# Patient Record
Sex: Female | Born: 1955 | Race: White | Hispanic: No | Smoking: Former smoker
Health system: Southern US, Community
[De-identification: ages and names within clinical notes are randomized; demographics above are authoritative.]

## PROBLEM LIST (undated history)

## (undated) DIAGNOSIS — R87629 Unspecified abnormal cytological findings in specimens from vagina: Secondary | ICD-10-CM

## (undated) DIAGNOSIS — N3289 Other specified disorders of bladder: Secondary | ICD-10-CM

## (undated) DIAGNOSIS — K224 Dyskinesia of esophagus: Secondary | ICD-10-CM

## (undated) DIAGNOSIS — N951 Menopausal and female climacteric states: Secondary | ICD-10-CM

## (undated) DIAGNOSIS — K229 Disease of esophagus, unspecified: Secondary | ICD-10-CM

## (undated) DIAGNOSIS — K317 Polyp of stomach and duodenum: Secondary | ICD-10-CM

## (undated) DIAGNOSIS — N939 Abnormal uterine and vaginal bleeding, unspecified: Secondary | ICD-10-CM

## (undated) DIAGNOSIS — E119 Type 2 diabetes mellitus without complications: Secondary | ICD-10-CM

## (undated) DIAGNOSIS — Z9889 Other specified postprocedural states: Secondary | ICD-10-CM

## (undated) DIAGNOSIS — T8859XA Other complications of anesthesia, initial encounter: Secondary | ICD-10-CM

## (undated) DIAGNOSIS — K635 Polyp of colon: Secondary | ICD-10-CM

## (undated) DIAGNOSIS — R112 Nausea with vomiting, unspecified: Secondary | ICD-10-CM

## (undated) DIAGNOSIS — D126 Benign neoplasm of colon, unspecified: Secondary | ICD-10-CM

## (undated) DIAGNOSIS — I10 Essential (primary) hypertension: Secondary | ICD-10-CM

## (undated) DIAGNOSIS — R638 Other symptoms and signs concerning food and fluid intake: Secondary | ICD-10-CM

## (undated) DIAGNOSIS — K219 Gastro-esophageal reflux disease without esophagitis: Secondary | ICD-10-CM

## (undated) DIAGNOSIS — E669 Obesity, unspecified: Secondary | ICD-10-CM

## (undated) DIAGNOSIS — F419 Anxiety disorder, unspecified: Secondary | ICD-10-CM

## (undated) DIAGNOSIS — E781 Pure hyperglyceridemia: Secondary | ICD-10-CM

## (undated) DIAGNOSIS — R7881 Bacteremia: Secondary | ICD-10-CM

## (undated) HISTORY — DX: Type 2 diabetes mellitus without complications: E11.9

## (undated) HISTORY — DX: Menopausal and female climacteric states: N95.1

## (undated) HISTORY — PX: CERVICAL DISCECTOMY: SHX98

## (undated) HISTORY — DX: Abnormal uterine and vaginal bleeding, unspecified: N93.9

## (undated) HISTORY — DX: Unspecified abnormal cytological findings in specimens from vagina: R87.629

## (undated) HISTORY — DX: Gastro-esophageal reflux disease without esophagitis: K21.9

## (undated) HISTORY — PX: OTHER SURGICAL HISTORY: SHX169

## (undated) HISTORY — PX: BACK SURGERY: SHX140

## (undated) HISTORY — PX: TUBAL LIGATION: SHX77

## (undated) HISTORY — DX: Other symptoms and signs concerning food and fluid intake: R63.8

## (undated) HISTORY — DX: Essential (primary) hypertension: I10

## (undated) HISTORY — PX: COLONOSCOPY: SHX174

## (undated) HISTORY — PX: KNEE SURGERY: SHX244

## (undated) HISTORY — PX: LEEP: SHX91

## (undated) HISTORY — DX: Anxiety disorder, unspecified: F41.9

---

## 2004-10-20 ENCOUNTER — Ambulatory Visit: Payer: Self-pay | Admitting: Obstetrics and Gynecology

## 2005-09-29 ENCOUNTER — Ambulatory Visit: Payer: Self-pay

## 2006-09-19 ENCOUNTER — Other Ambulatory Visit: Payer: Self-pay

## 2006-09-19 ENCOUNTER — Observation Stay: Payer: Self-pay | Admitting: Internal Medicine

## 2006-09-20 ENCOUNTER — Other Ambulatory Visit: Payer: Self-pay

## 2007-03-27 ENCOUNTER — Ambulatory Visit: Payer: Self-pay

## 2011-08-25 ENCOUNTER — Ambulatory Visit: Payer: Self-pay | Admitting: Obstetrics and Gynecology

## 2012-02-09 ENCOUNTER — Ambulatory Visit: Payer: Self-pay | Admitting: Obstetrics and Gynecology

## 2012-02-09 DIAGNOSIS — I10 Essential (primary) hypertension: Secondary | ICD-10-CM

## 2012-02-09 LAB — CBC
HCT: 42.8 % (ref 35.0–47.0)
MCH: 29.9 pg (ref 26.0–34.0)
MCV: 91 fL (ref 80–100)
Platelet: 292 10*3/uL (ref 150–440)
RBC: 4.72 10*6/uL (ref 3.80–5.20)
WBC: 6.6 10*3/uL (ref 3.6–11.0)

## 2012-02-09 LAB — BASIC METABOLIC PANEL
BUN: 14 mg/dL (ref 7–18)
Calcium, Total: 8.8 mg/dL (ref 8.5–10.1)
Co2: 25 mmol/L (ref 21–32)
Creatinine: 0.82 mg/dL (ref 0.60–1.30)
EGFR (Non-African Amer.): >60
Osmolality: 281 (ref 275–301)
Potassium: 3.9 mmol/L (ref 3.5–5.1)
Sodium: 141 mmol/L (ref 136–145)

## 2012-02-14 ENCOUNTER — Ambulatory Visit: Payer: Self-pay | Admitting: Obstetrics and Gynecology

## 2012-10-25 ENCOUNTER — Ambulatory Visit: Payer: Self-pay | Admitting: Obstetrics and Gynecology

## 2014-09-11 LAB — HM PAP SMEAR: HM Pap smear: NEGATIVE

## 2014-09-18 ENCOUNTER — Ambulatory Visit: Payer: Self-pay | Admitting: Obstetrics and Gynecology

## 2014-09-18 LAB — HM MAMMOGRAPHY

## 2014-12-03 DIAGNOSIS — E781 Pure hyperglyceridemia: Secondary | ICD-10-CM | POA: Insufficient documentation

## 2014-12-03 DIAGNOSIS — K219 Gastro-esophageal reflux disease without esophagitis: Secondary | ICD-10-CM | POA: Insufficient documentation

## 2014-12-03 DIAGNOSIS — F411 Generalized anxiety disorder: Secondary | ICD-10-CM | POA: Insufficient documentation

## 2014-12-03 DIAGNOSIS — R638 Other symptoms and signs concerning food and fluid intake: Secondary | ICD-10-CM | POA: Insufficient documentation

## 2014-12-03 DIAGNOSIS — I10 Essential (primary) hypertension: Secondary | ICD-10-CM | POA: Insufficient documentation

## 2014-12-11 ENCOUNTER — Ambulatory Visit: Payer: Self-pay | Admitting: Gastroenterology

## 2014-12-16 ENCOUNTER — Ambulatory Visit: Payer: Self-pay | Admitting: Gastroenterology

## 2014-12-16 DIAGNOSIS — N3289 Other specified disorders of bladder: Secondary | ICD-10-CM

## 2014-12-16 HISTORY — DX: Other specified disorders of bladder: N32.89

## 2015-03-09 NOTE — Op Note (Signed)
PATIENT NAME:  Gloria Wilkins, Gloria Wilkins MR#:  [id number] DATE OF BIRTH:  [birth year]  DATE OF PROCEDURE:  02/14/2012  PREOPERATIVE DIAGNOSIS: Abnormal uterine bleeding on hormone replacement therapy.   POSTOPERATIVE DIAGNOSIS: Abnormal uterine bleeding on hormone replacement therapy.   PROCEDURES PERFORMED: 1. Hysteroscopy.  2. Dilation and curettage of endometrium.  3. NovaSure endometrial ablation.   SURGEON: Alanda Slim. DeFrancesco, MD   FIRST ASSISTANT: None.   ANESTHESIA: General.   INDICATIONS: The patient is a 59 year old white female who presents for surgical management of chronic abnormal uterine bleeding while on hormone replacement therapy. The patient did have a preoperative endometrial biopsy that was benign. Pelvic ultrasound revealed no significant abnormalities.   Findings at surgery revealed a uterus which sounded to 8.5 cm. The cervical length was 4.5 cm. The endometrial cavity had no significant pathology identified on hysteroscopy. Post NovaSure view revealed a good char effect.   DESCRIPTION OF PROCEDURE: The patient was brought to the operating room where she was placed in the supine position. General anesthesia was induced without difficulty. She was placed in the dorsal lithotomy position using the candy-cane stirrups. A Betadine perineal and intravaginal prep and drape was performed in the standard fashion. A red Robinson catheter was used to drain 250 mL of urine from the bladder. A weighted speculum was placed into the vagina, a single-tooth tenaculum was placed on the anterior lip of the cervix, and the uterus was sounded to 8.5 cm.  Hegar dilators were used up to a #8 caliber to dilate the endocervical canal. The cervical length was measured at 4.5 cm. Hysteroscopy was performed which revealed an endometrial cavity devoid of pathologic lesions. This was followed by curettage of the endometrium with both smooth and serrated curettes. This tissue was sent to pathology. Next, the  NovaSure ablation was performed with the NovaSure instrument being set with a cavity length of 4 cm. Placement of the NovaSure, in the intracavitary position, was appropriate with deployment of the ablation instrument. The cavity width was 3.5 cm. Cavity test was completed and the NovaSure ablation was performed over 75 seconds. Following completion of the procedure, the instrument was removed. Repeat hysteroscopy was performed and revealed a good char effect of the endometrial cavity. This was photo documented. Upon completion of the procedure, all instrumentation was removed. The patient was then mobilized, awakened, and taken to the recovery room in satisfactory condition. Estimated blood loss was minimal. There were no complications. All instrument, needle, and sponge counts were verified as correct. ____________________________ Alanda Slim. DeFrancesco, MD mad:slb D: 02/14/2012 14:57:28 ET T: 02/14/2012 16:05:46 ET JOB#: [id number]  cc: Hassell Done A. DeFrancesco, MD, <Dictator> Alanda Slim DEFRANCESCO MD ELECTRONICALLY SIGNED 02/22/2012 13:32

## 2015-03-09 NOTE — H&P (Signed)
PATIENT NAME:  ODETTE, Gloria Wilkins MR#:  [id number] DATE OF BIRTH:  1956/05/14  DATE OF ADMISSION:  02/14/2012  DIAGNOSIS: Symptomatic abnormal uterine bleeding on hormone replacement therapy.  HISTORY: Gloria Wilkins is a 59 year old married white female, para 2-0-0-2, using BTL for contraception, on Climara pro patch weekly as well as Estrace cream intravaginally biweekly, presents for definitive surgical management of this condition through conservative surgery. Patient has had pelvic ultrasound which was normal. Preoperative endometrial biopsy revealed benign findings showing weakly proliferative endometrium on biopsy of 01/19/2012. Patient desires definitive conservative surgery through hysteroscopy, dilation and curettage with NovaSure.   PAST MEDICAL HISTORY:  1. Chronic hypertension.  2. Obesity.  3. Unstable bladder.  4. Atypical chest pain with normal Myoview 09/2006.  5. Gastroesophageal reflux disease.   PAST SURGICAL HISTORY:  1. Diskectomy in 1990.  2. Bilateral tubal ligation 1980.  PAST OB HISTORY: Para 2-0-0-2; cesarean section x1, spontaneous vaginal delivery x1.   PAST GYN HISTORY: Patient denies history of abnormal Pap smears.   FAMILY HISTORY: Breast cancer in two maternal aunts. No history of colon or ovary cancer.   SOCIAL HISTORY: Patient is a nonsmoker. She quit in 2002. Patient denies alcohol use and drug use. Patient is a Biochemist, clinical at [employer].   REVIEW OF SYSTEMS: Patient denies recent illness. She denies history of coagulopathy. She denies reactive airway disease.   MEDICATIONS: 1. Plendil 1 tablet daily. 2. Bystolic 5 mg daily. 3. Omeprazole 20 mg daily.  4. Potassium daily. 5. Climara pro patch weekly. 6. Estrace cream one applicator biweekly.   DRUG ALLERGIES: Sulfa.   PHYSICAL EXAMINATION:  VITAL SIGNS: Height 5 feet 4 inches, weight 236, blood pressure 147/87, heart rate 62, BMI 41.   GENERAL: Patient is a pleasant well-appearing white female  in no acute distress. She is alert and oriented. Affect is appropriate.   NECK: Supple. There is no thyromegaly or adenopathy.   LUNGS: Clear.   HEART: Regular rate and rhythm without murmur, S3 or S4.   ABDOMEN: Soft, nontender. Moderate pannus is appreciated. No hepatosplenomegaly. No pelvic masses. No hernias.   PELVIC: External genitalia notable for a 2 cm verrucous mole on her mons pubis. Perineum is otherwise normal. BUS normal. Vagina has good estrogen effect. Cervix is without lesions. Uterus is midplane. Size difficult to assess secondary to body habitus. There are no adnexal masses appreciated. The uterus did sound to 7 cm on sounding.   EXTREMITIES: Without clubbing, cyanosis, or edema.   LABORATORY, DIAGNOSTIC AND RADIOLOGICAL DATA: Ultrasound from 01/05/2012 revealed a uterus measuring 6.41 x 4.57 x 3.81 cm. Endometrial stripe was 0.49 cm. Left and right ovaries were normal. Endometrial biopsy from 01/19/2012 revealed benign inactive weakly proliferative endometrium. No hyperplasia, atypia or malignancy is seen.   IMPRESSION: Symptomatic abnormal uterine bleeding on continuous hormone replacement therapy with normal pelvic ultrasound and benign endometrial biopsy.   PLAN: Diagnostic hysteroscopy with dilation and curettage of the endometrium and NovaSure endometrial ablation. Date of surgery 02/14/2012.    CONSENT NOTE: Gloria Wilkins is to undergo surgery on 02/14/2012. She is understanding of the planned procedures and is aware of and is accepting of all surgical risks which include but are not limited to bleeding, infection, pelvic organ injury with need for repair, uterine perforation, anesthesia risks, and death. All questions are answered. Informed consent is given. Patient is ready and willing to proceed with surgery as scheduled.  ____________________________ Alanda Slim Ji Feldner, MD mad:cms D: 02/11/2012 11:02:59 ET T: 02/11/2012  12:10:02  ET JOB#: [id number]  cc: Hassell Done A. Shine Mikes, MD, <Dictator> Alanda Slim Alif Petrak MD ELECTRONICALLY SIGNED 02/12/2012 9:22

## 2015-03-10 LAB — SURGICAL PATHOLOGY

## 2015-04-04 ENCOUNTER — Other Ambulatory Visit: Payer: Self-pay | Admitting: Sports Medicine

## 2015-04-04 DIAGNOSIS — M25562 Pain in left knee: Secondary | ICD-10-CM

## 2015-04-04 DIAGNOSIS — S83282A Other tear of lateral meniscus, current injury, left knee, initial encounter: Secondary | ICD-10-CM

## 2015-04-08 ENCOUNTER — Ambulatory Visit
Admission: RE | Admit: 2015-04-08 | Discharge: 2015-04-08 | Disposition: A | Discharge status: Home / Self Care | Payer: Managed Care, Other (non HMO) | Source: Ambulatory Visit | Attending: Sports Medicine | Admitting: Sports Medicine

## 2015-04-08 DIAGNOSIS — M1712 Unilateral primary osteoarthritis, left knee: Secondary | ICD-10-CM | POA: Insufficient documentation

## 2015-04-08 DIAGNOSIS — M25862 Other specified joint disorders, left knee: Secondary | ICD-10-CM | POA: Insufficient documentation

## 2015-04-08 DIAGNOSIS — M25562 Pain in left knee: Secondary | ICD-10-CM

## 2015-04-08 DIAGNOSIS — S83282A Other tear of lateral meniscus, current injury, left knee, initial encounter: Secondary | ICD-10-CM

## 2015-04-08 DIAGNOSIS — S83242A Other tear of medial meniscus, current injury, left knee, initial encounter: Secondary | ICD-10-CM | POA: Diagnosis not present

## 2015-09-17 ENCOUNTER — Encounter: Payer: Self-pay | Admitting: Obstetrics and Gynecology

## 2015-09-17 ENCOUNTER — Ambulatory Visit (INDEPENDENT_AMBULATORY_CARE_PROVIDER_SITE_OTHER): Payer: Managed Care, Other (non HMO) | Admitting: Obstetrics and Gynecology

## 2015-09-17 VITALS — BP 112/73 | HR 63 | Ht 65.0 in | Wt 247.2 lb

## 2015-09-17 DIAGNOSIS — Z1239 Encounter for other screening for malignant neoplasm of breast: Secondary | ICD-10-CM

## 2015-09-17 DIAGNOSIS — Z01419 Encounter for gynecological examination (general) (routine) without abnormal findings: Secondary | ICD-10-CM

## 2015-09-17 DIAGNOSIS — Z780 Asymptomatic menopausal state: Secondary | ICD-10-CM

## 2015-09-17 DIAGNOSIS — Z0000 Encounter for general adult medical examination without abnormal findings: Secondary | ICD-10-CM | POA: Diagnosis not present

## 2015-09-17 DIAGNOSIS — E669 Obesity, unspecified: Secondary | ICD-10-CM

## 2015-09-17 MED ORDER — ESTRADIOL-LEVONORGESTREL 0.045-0.015 MG/DAY TD PTWK: 1.0000 | MEDICATED_PATCH | TRANSDERMAL | Status: DC

## 2015-09-17 MED ORDER — SERTRALINE HCL 50 MG PO TABS: 50.0000 mg | ORAL_TABLET | Freq: Every day | ORAL | Status: DC

## 2015-09-17 NOTE — Progress Notes (Signed)
Patient ID: Gloria Wilkins, female   DOB: 20-Mar-1956, 59 y.o.   MRN: [id number] ANNUAL PREVENTATIVE CARE GYN  ENCOUNTER NOTE  Subjective:       Gloria Wilkins is a 59 y.o. G47P2002 female here for a routine annual gynecologic exam.  Current complaints: 1.  none  Colonoscopy this year demonstrated 15 polyps, a few adenomatous; repeat colonoscopy in 2 years.  Is planned.   Gynecologic History No LMP recorded. Patient has had an ablation. Contraception: ablation Last Pap: 09/03/2014 neg/neg. Results were: normal Last mammogram: 2015. Results were: normal  Obstetric History OB History  Gravida Para Term Preterm AB SAB TAB Ectopic Multiple Living  2 2 2       2     # Outcome Date GA Lbr Len/2nd Weight Sex Delivery Anes PTL Lv  2 Term 64     CS-LTranv   Y  1 Term 19     Vag-Spont   Y      Past Medical History  Diagnosis Date  . Vaginal Pap smear, abnormal   . Abnormal uterine bleeding (AUB)   . GERD (gastroesophageal reflux disease)   . HTN (hypertension)   . Perimenopausal   . Anxiety   . Increased BMI   . DM (diabetes mellitus) ([place])     Past Surgical History  Procedure Laterality Date  . Ablation      endometrial  . Cervical discectomy    . Leep    . Tubal ligation    . Knee surgery Left     Current Outpatient Prescriptions on File Prior to Visit  Medication Sig Dispense Refill  . estradiol-levonorgestrel (CLIMARAPRO) 0.045-0.015 MG/DAY Place onto the skin.    . felodipine (PLENDIL) 5 MG 24 hr tablet Take by mouth.    . Fenofibrate 40 MG TABS Take by mouth.    . losartan (COZAAR) 25 MG tablet Take by mouth.    . metFORMIN (GLUCOPHAGE) 500 MG tablet Take by mouth.    . nebivolol (BYSTOLIC) 5 MG tablet Take by mouth.    . pantoprazole (PROTONIX) 20 MG tablet Take by mouth.    . Potassium 99 MG TABS Take by mouth.    . sertraline (ZOLOFT) 50 MG tablet Take by mouth.     No current facility-administered medications on file prior to visit.    Allergies   Allergen Reactions  . Sulfa Antibiotics Rash    Social History   Social History  . Marital Status: Married    Spouse Name: N/A  . Number of Children: N/A  . Years of Education: N/A   Occupational History  . Not on file.   Social History Main Topics  . Smoking status: Former Smoker    Quit date: 11/15/2000  . Smokeless tobacco: Not on file  . Alcohol Use: No  . Drug Use: No  . Sexual Activity: Yes    Birth Control/ Protection: Surgical   Other Topics Concern  . Not on file   Social History Narrative    Family History  Problem Relation Age of Onset  . Heart disease Father   . Breast cancer Maternal Aunt   . Diabetes Neg Hx   . Colon cancer Neg Hx   . Ovarian cancer Neg Hx     The following portions of the patient's history were reviewed and updated as appropriate: allergies, current medications, past family history, past medical history, past social history, past surgical history and problem list.  Review of Systems ROS Review of Systems -  General ROS: negative for - chills, fatigue, fever, hot flashes, night sweats, weight gain or weight loss Psychological ROS: negative for - anxiety, decreased libido, depression, mood swings, physical abuse or sexual abuse Ophthalmic ROS: negative for - blurry vision, eye pain or loss of vision ENT ROS: negative for - headaches, hearing change, visual changes or vocal changes Allergy and Immunology ROS: negative for - hives, itchy/watery eyes or seasonal allergies Hematological and Lymphatic ROS: negative for - bleeding problems, bruising, swollen lymph nodes or weight loss Endocrine ROS: negative for - galactorrhea, hair pattern changes, hot flashes, malaise/lethargy, mood swings, palpitations, polydipsia/polyuria, skin changes, temperature intolerance or unexpected weight changes Breast ROS: negative for - new or changing breast lumps or nipple discharge Respiratory ROS: negative for - cough or shortness of  breath Cardiovascular ROS: negative for - chest pain, irregular heartbeat, palpitations or shortness of breath Gastrointestinal ROS: no abdominal pain, change in bowel habits, or black or bloody stools Genito-Urinary ROS: no dysuria, trouble voiding, or hematuria Musculoskeletal ROS: negative for - joint pain or joint stiffness Neurological ROS: negative for - bowel and bladder control changes Dermatological ROS: negative for rash and skin lesion changes   Objective:   BP 112/73 mmHg  Pulse 63  Ht 5\' 5"  (1.651 m)  Wt 247 lb 3.2 oz (112.129 kg)  BMI 41.14 kg/m2 CONSTITUTIONAL: Well-developed, well-nourished female in no acute distress.  PSYCHIATRIC: Normal mood and affect. Normal behavior. Normal judgment and thought content. [place]: Alert and oriented to person, place, and time. Normal muscle tone coordination. No cranial nerve deficit noted. HENT:  Normocephalic, atraumatic, External right and left ear normal. Oropharynx is clear and moist EYES: Conjunctivae and EOM are normal. Pupils are equal, round, and reactive to light. No scleral icterus.  NECK: Normal range of motion, supple, no masses.  Normal thyroid.  SKIN: Skin is warm and dry. No rash noted. Not diaphoretic. No erythema. No pallor. CARDIOVASCULAR: Normal heart rate noted, regular rhythm, no murmur. RESPIRATORY: Clear to auscultation bilaterally. Effort and breath sounds normal, no problems with respiration noted. BREASTS: Symmetric in size. No masses, skin changes, nipple drainage, or lymphadenopathy. ABDOMEN: Soft, normal bowel sounds, no distention noted.  No tenderness, rebound or guarding. Large pannus BLADDER: Normal PELVIC:  External Genitalia: Normal  BUS: Normal  Vagina: Normal  Cervix: Normal; no cervical motion tenderness  Uterus: Normal; not palpable  Adnexa: Normal  RV: External Exam NormaI, No Rectal Masses and Normal Sphincter tone  MUSCULOSKELETAL: Normal range of motion. No tenderness.  No cyanosis,  clubbing, or edema.  2+ distal pulses. LYMPHATIC: No Axillary, Supraclavicular, or Inguinal Adenopathy.    Assessment:   Annual gynecologic examination 59 y.o. Contraception: ablation Obesity 1  Menopause Plan:  Pap: Not needed Mammogram: Ordered Stool Guaiac Testing:  Not Indicated- colonoscopy- 12/2014 Labs: thru pcp Routine preventative health maintenance measures emphasized: Exercise/Diet/Weight control, Tobacco Warnings and Alcohol/Substance use risks Refill Climara pro. Continue calcium and vitamin D supplementation. Continue with healthy eating, exercise, and weight loss at 1 pound per month goal. Return to [place], CMA  Brayton Mars, MD  Note: This dictation was prepared with Dragon dictation along with smaller phrase technology. Any transcriptional errors that result from this process are unintentional.

## 2015-09-17 NOTE — Patient Instructions (Signed)
1.No Pap smear until 2018 2.  Mammogram recommended. 3.  No stool guaiac testing this year due to recent colonoscopy. 4.  Climara pro is refill. 5.  Continue with calcium and vitamin D supplementation. 6.  Continue with healthier eating, exercise, and weight loss 1 month GOAL. 7.  Return in 1 year

## 2016-05-13 ENCOUNTER — Encounter: Payer: Self-pay | Admitting: Obstetrics and Gynecology

## 2016-05-13 ENCOUNTER — Ambulatory Visit (INDEPENDENT_AMBULATORY_CARE_PROVIDER_SITE_OTHER): Payer: Managed Care, Other (non HMO) | Admitting: Obstetrics and Gynecology

## 2016-05-13 VITALS — BP 102/66 | HR 60 | Ht 65.0 in | Wt 257.0 lb

## 2016-05-13 DIAGNOSIS — Z780 Asymptomatic menopausal state: Secondary | ICD-10-CM | POA: Diagnosis not present

## 2016-05-13 DIAGNOSIS — N951 Menopausal and female climacteric states: Secondary | ICD-10-CM | POA: Diagnosis not present

## 2016-05-13 MED ORDER — EST ESTROGENS-METHYLTEST 1.25-2.5 MG PO TABS: 1.0000 | ORAL_TABLET | Freq: Every day | ORAL | Status: DC

## 2016-05-13 MED ORDER — MEDROXYPROGESTERONE ACETATE 5 MG PO TABS: ORAL_TABLET | ORAL | Status: DC

## 2016-05-13 NOTE — Patient Instructions (Signed)
1. Stop Climara Pro 2. Begin Estratest 1.25/2.5 mg daily 3. Begin Provera 5 mg daily 4. Return in 6 weeks for follow-up

## 2016-05-28 DIAGNOSIS — Z780 Asymptomatic menopausal state: Secondary | ICD-10-CM | POA: Insufficient documentation

## 2016-05-28 DIAGNOSIS — N951 Menopausal and female climacteric states: Secondary | ICD-10-CM | POA: Insufficient documentation

## 2016-05-28 NOTE — Progress Notes (Signed)
Chief complaint: 1. Vasomotor symptoms 2. HRT therapy  Patient presents for follow-up on medication. Climara Pro patch is not effectively controlling her symptomatology. Other options of vasomotor symptom control were reviewed including both hormonal as well as non hormonal therapies.  Patient has increased exercise activity and is working on weight loss.  OBJECTIVE: BP 102/66 mmHg  Pulse 60  Ht 5\' 5"  (1.651 m)  Wt 257 lb (116.574 kg)  BMI 42.77 kg/m2   Plan at this time is to stop the Climara Pro patch. Began Estratest 1.25/2.5 mg daily along with Provera 5 mg daily Patient is to monitor symptomatology and return in 6 weeks for follow-up  A total of 15 minutes were spent face-to-face with the patient during this encounter and over half of that time dealt with counseling and coordination of care.  Brayton Mars, MD   Note: This dictation was prepared with Dragon dictation along with smaller phrase technology. Any transcriptional errors that result from this process are unintentional.

## 2016-06-23 ENCOUNTER — Ambulatory Visit: Payer: Managed Care, Other (non HMO) | Admitting: Obstetrics and Gynecology

## 2016-08-11 ENCOUNTER — Ambulatory Visit
Admission: RE | Admit: 2016-08-11 | Discharge: 2016-08-11 | Disposition: A | Discharge status: Home / Self Care | Payer: Managed Care, Other (non HMO) | Source: Ambulatory Visit | Attending: Nurse Practitioner | Admitting: Nurse Practitioner

## 2016-08-11 ENCOUNTER — Other Ambulatory Visit: Payer: Self-pay | Admitting: Nurse Practitioner

## 2016-08-11 DIAGNOSIS — R52 Pain, unspecified: Secondary | ICD-10-CM

## 2016-08-13 ENCOUNTER — Inpatient Hospital Stay
Admission: AD | Admit: 2016-08-13 | Discharge: 2016-08-15 | DRG: 872 | Disposition: A | Discharge status: Home / Self Care | Payer: Managed Care, Other (non HMO) | Source: Ambulatory Visit | Attending: Internal Medicine | Admitting: Internal Medicine

## 2016-08-13 ENCOUNTER — Encounter: Payer: Self-pay | Admitting: *Deleted

## 2016-08-13 DIAGNOSIS — Z6841 Body Mass Index (BMI) 40.0 and over, adult: Secondary | ICD-10-CM | POA: Diagnosis not present

## 2016-08-13 DIAGNOSIS — Z79890 Hormone replacement therapy (postmenopausal): Secondary | ICD-10-CM | POA: Diagnosis not present

## 2016-08-13 DIAGNOSIS — F419 Anxiety disorder, unspecified: Secondary | ICD-10-CM | POA: Diagnosis present

## 2016-08-13 DIAGNOSIS — R7881 Bacteremia: Principal | ICD-10-CM | POA: Diagnosis present

## 2016-08-13 DIAGNOSIS — Z87891 Personal history of nicotine dependence: Secondary | ICD-10-CM | POA: Diagnosis not present

## 2016-08-13 DIAGNOSIS — I10 Essential (primary) hypertension: Secondary | ICD-10-CM | POA: Diagnosis present

## 2016-08-13 DIAGNOSIS — E119 Type 2 diabetes mellitus without complications: Secondary | ICD-10-CM | POA: Diagnosis present

## 2016-08-13 DIAGNOSIS — A419 Sepsis, unspecified organism: Secondary | ICD-10-CM

## 2016-08-13 DIAGNOSIS — Z7984 Long term (current) use of oral hypoglycemic drugs: Secondary | ICD-10-CM

## 2016-08-13 DIAGNOSIS — R509 Fever, unspecified: Secondary | ICD-10-CM | POA: Diagnosis present

## 2016-08-13 DIAGNOSIS — Z79899 Other long term (current) drug therapy: Secondary | ICD-10-CM | POA: Diagnosis not present

## 2016-08-13 DIAGNOSIS — K219 Gastro-esophageal reflux disease without esophagitis: Secondary | ICD-10-CM | POA: Diagnosis present

## 2016-08-13 DIAGNOSIS — Z8249 Family history of ischemic heart disease and other diseases of the circulatory system: Secondary | ICD-10-CM

## 2016-08-13 DIAGNOSIS — E669 Obesity, unspecified: Secondary | ICD-10-CM | POA: Diagnosis present

## 2016-08-13 DIAGNOSIS — E1110 Type 2 diabetes mellitus with ketoacidosis without coma: Secondary | ICD-10-CM

## 2016-08-13 HISTORY — DX: Sepsis, unspecified organism: A41.9

## 2016-08-13 LAB — CBC WITH DIFFERENTIAL/PLATELET
Basophils Absolute: 0.1 10*3/uL (ref 0–0.1)
Basophils Relative: 1 %
EOS ABS: 0.2 10*3/uL (ref 0–0.7)
Eosinophils Relative: 2 %
HEMATOCRIT: 42.6 % (ref 35.0–47.0)
HEMOGLOBIN: 14.7 g/dL (ref 12.0–16.0)
LYMPHS ABS: 2.1 10*3/uL (ref 1.0–3.6)
Lymphocytes Relative: 30 %
MCH: 30.2 pg (ref 26.0–34.0)
MCHC: 34.5 g/dL (ref 32.0–36.0)
MCV: 87.4 fL (ref 80.0–100.0)
MONO ABS: 0.6 10*3/uL (ref 0.2–0.9)
MONOS PCT: 8 %
NEUTROS ABS: 4 10*3/uL (ref 1.4–6.5)
NEUTROS PCT: 59 %
Platelets: 335 10*3/uL (ref 150–440)
RBC: 4.87 MIL/uL (ref 3.80–5.20)
RDW: 14.8 % — ABNORMAL HIGH (ref 11.5–14.5)
WBC: 6.9 10*3/uL (ref 3.6–11.0)

## 2016-08-13 LAB — COMPREHENSIVE METABOLIC PANEL
ALBUMIN: 3.7 g/dL (ref 3.5–5.0)
ALK PHOS: 69 U/L (ref 38–126)
ALT: 16 U/L (ref 14–54)
ANION GAP: 7 (ref 5–15)
AST: 22 U/L (ref 15–41)
BUN: 15 mg/dL (ref 6–20)
CALCIUM: 8.9 mg/dL (ref 8.9–10.3)
CO2: 24 mmol/L (ref 22–32)
Chloride: 104 mmol/L (ref 101–111)
Creatinine, Ser: 0.76 mg/dL (ref 0.44–1.00)
GFR calc Af Amer: >60 mL/min (ref >60)
GFR calc non Af Amer: >60 mL/min (ref >60)
GLUCOSE: 129 mg/dL — AB (ref 65–99)
Potassium: 4.3 mmol/L (ref 3.5–5.1)
SODIUM: 135 mmol/L (ref 135–145)
Total Bilirubin: 0.3 mg/dL (ref 0.3–1.2)
Total Protein: 7.5 g/dL (ref 6.5–8.1)

## 2016-08-13 LAB — MAGNESIUM: Magnesium: 2.1 mg/dL (ref 1.7–2.4)

## 2016-08-13 LAB — GLUCOSE, CAPILLARY
GLUCOSE-CAPILLARY: 114 mg/dL — AB (ref 65–99)
Glucose-Capillary: 137 mg/dL — ABNORMAL HIGH (ref 65–99)
Glucose-Capillary: 111 mg/dL — ABNORMAL HIGH (ref 65–99)

## 2016-08-13 MED ORDER — DIPHENHYDRAMINE HCL 50 MG/ML IJ SOLN
INTRAMUSCULAR | Status: AC
  Administered 2016-08-13: 25 mg via INTRAVENOUS
  Filled 2016-08-13: qty 1

## 2016-08-13 MED ORDER — INSULIN ASPART 100 UNIT/ML ~~LOC~~ SOLN: 0.0000 [IU] | Freq: Three times a day (TID) | SUBCUTANEOUS | Status: DC

## 2016-08-13 MED ORDER — NEBIVOLOL HCL 5 MG PO TABS
5.0000 mg | ORAL_TABLET | Freq: Every day | ORAL | Status: DC
  Administered 2016-08-14 – 2016-08-15 (×2): 5 mg via ORAL
  Filled 2016-08-13 (×2): qty 1

## 2016-08-13 MED ORDER — VITAMIN D 1000 UNITS PO TABS
1000.0000 [IU] | ORAL_TABLET | Freq: Every day | ORAL | Status: DC
  Administered 2016-08-14 – 2016-08-15 (×2): 1000 [IU] via ORAL
  Filled 2016-08-13 (×2): qty 1

## 2016-08-13 MED ORDER — FENOFIBRATE 160 MG PO TABS
160.0000 mg | ORAL_TABLET | Freq: Every day | ORAL | Status: DC
Start: 2016-08-13 — End: 2016-08-15
  Administered 2016-08-13 – 2016-08-14 (×2): 160 mg via ORAL
  Filled 2016-08-13 (×3): qty 1

## 2016-08-13 MED ORDER — PANTOPRAZOLE SODIUM 40 MG PO TBEC
40.0000 mg | DELAYED_RELEASE_TABLET | Freq: Every day | ORAL | Status: DC
  Administered 2016-08-14 – 2016-08-15 (×2): 40 mg via ORAL
  Filled 2016-08-13 (×2): qty 1

## 2016-08-13 MED ORDER — HEPARIN SODIUM (PORCINE) 5000 UNIT/ML IJ SOLN
5000.0000 [IU] | Freq: Three times a day (TID) | INTRAMUSCULAR | Status: DC
  Administered 2016-08-14 – 2016-08-15 (×3): 5000 [IU] via SUBCUTANEOUS
  Filled 2016-08-13 (×3): qty 1

## 2016-08-13 MED ORDER — ONDANSETRON HCL 4 MG PO TABS: 4.0000 mg | ORAL_TABLET | Freq: Four times a day (QID) | ORAL | Status: DC | PRN

## 2016-08-13 MED ORDER — INSULIN ASPART 100 UNIT/ML ~~LOC~~ SOLN: 0.0000 [IU] | Freq: Every day | SUBCUTANEOUS | Status: DC

## 2016-08-13 MED ORDER — ACETAMINOPHEN 325 MG PO TABS
650.0000 mg | ORAL_TABLET | Freq: Four times a day (QID) | ORAL | Status: DC | PRN
Start: 2016-08-13 — End: 2016-08-15
  Administered 2016-08-14: 650 mg via ORAL
  Filled 2016-08-13: qty 2

## 2016-08-13 MED ORDER — HEPARIN SODIUM (PORCINE) 5000 UNIT/ML IJ SOLN: 5000.0000 [IU] | Freq: Three times a day (TID) | INTRAMUSCULAR | Status: DC

## 2016-08-13 MED ORDER — ALBUTEROL SULFATE (2.5 MG/3ML) 0.083% IN NEBU: 2.5000 mg | INHALATION_SOLUTION | RESPIRATORY_TRACT | Status: DC | PRN

## 2016-08-13 MED ORDER — ONDANSETRON HCL 4 MG/2ML IJ SOLN: 4.0000 mg | Freq: Four times a day (QID) | INTRAMUSCULAR | Status: DC | PRN

## 2016-08-13 MED ORDER — ACETAMINOPHEN 650 MG RE SUPP: 650.0000 mg | Freq: Four times a day (QID) | RECTAL | Status: DC | PRN

## 2016-08-13 MED ORDER — ESTRADIOL-LEVONORGESTREL 0.045-0.015 MG/DAY TD PTWK: 1.0000 | MEDICATED_PATCH | TRANSDERMAL | Status: DC

## 2016-08-13 MED ORDER — VANCOMYCIN HCL IN DEXTROSE 1-5 GM/200ML-% IV SOLN
1000.0000 mg | Freq: Once | INTRAVENOUS | Status: DC
  Administered 2016-08-13: 1000 mg via INTRAVENOUS
  Filled 2016-08-13: qty 200

## 2016-08-13 MED ORDER — LOSARTAN POTASSIUM 50 MG PO TABS
25.0000 mg | ORAL_TABLET | Freq: Every day | ORAL | Status: DC
  Administered 2016-08-13 – 2016-08-14 (×2): 25 mg via ORAL
  Filled 2016-08-13 (×2): qty 2

## 2016-08-13 MED ORDER — FELODIPINE ER 5 MG PO TB24
5.0000 mg | ORAL_TABLET | Freq: Every day | ORAL | Status: DC
  Administered 2016-08-14 – 2016-08-15 (×2): 5 mg via ORAL
  Filled 2016-08-13 (×2): qty 1

## 2016-08-13 MED ORDER — DIPHENHYDRAMINE HCL 50 MG/ML IJ SOLN
25.0000 mg | Freq: Four times a day (QID) | INTRAMUSCULAR | Status: DC | PRN
  Administered 2016-08-13: 25 mg via INTRAVENOUS

## 2016-08-13 MED ORDER — SERTRALINE HCL 50 MG PO TABS
50.0000 mg | ORAL_TABLET | Freq: Every day | ORAL | Status: DC
  Administered 2016-08-14 – 2016-08-15 (×2): 50 mg via ORAL
  Filled 2016-08-13 (×2): qty 1

## 2016-08-13 MED ORDER — SODIUM CHLORIDE 0.9 % IV SOLN
INTRAVENOUS | Status: DC
  Administered 2016-08-13: 14:00:00 via INTRAVENOUS

## 2016-08-13 MED ORDER — LINEZOLID 600 MG/300ML IV SOLN
600.0000 mg | Freq: Once | INTRAVENOUS | Status: AC
  Administered 2016-08-13: 600 mg via INTRAVENOUS
  Filled 2016-08-13: qty 300

## 2016-08-13 NOTE — Progress Notes (Signed)
Pt having allergic reaction in form of severe itching, erythema, and hives on face and chest to vancomycin infusion. Pharmacy was called and made aware, MD aware and order for benadryl received.

## 2016-08-13 NOTE — Progress Notes (Addendum)
Patient medication list not complete. Mark as reviewed went over with patient. Paged on call pharmacy rep no response.

## 2016-08-13 NOTE — H&P (Signed)
[place] [facility] [place] NAME: Gloria Wilkins    MR#:  [id number]  DATE OF BIRTH:  03/10/1956  DATE OF ADMISSION:  08/13/2016  PRIMARY CARE PHYSICIAN: Perrin Maltese, MD   REQUESTING/REFERRING PHYSICIAN: Perrin Maltese, MD   CHIEF COMPLAINT:  No chief complaint on file.  Positive blood culture. HISTORY OF PRESENT ILLNESS:  Gloria Wilkins  is a 60 y.o. female with a known history of HTN, DM, Diverticulosis and diverticulitis. The patient has history of diverticulosis and had abdominal pain 2 weeks ago. She was suspected to have diverticulitis and was treated with antibiotics. The patient has fever and chills yesterday, went to see her primary care physician. Chest x-ray and urinalysis are negative. But she has fever 100,  leukocytosis at 14,000.  blood culture showed gram-positive cocci. So the patient was called for direct admission to the hospital. The patient denies any abdominal pain, nausea, vomiting or diarrhea.  PAST MEDICAL HISTORY:   Past Medical History:  Diagnosis Date  . Abnormal uterine bleeding (AUB)   . Anxiety   . DM (diabetes mellitus) ([place])   . GERD (gastroesophageal reflux disease)   . HTN (hypertension)   . Increased BMI   . Perimenopausal   . Vaginal Pap smear, abnormal     PAST SURGICAL HISTORY:   Past Surgical History:  Procedure Laterality Date  . ablation     endometrial  . CERVICAL DISCECTOMY    . KNEE SURGERY Left   . LEEP    . TUBAL LIGATION      SOCIAL HISTORY:   Social History  Substance Use Topics  . Smoking status: Former Smoker    Quit date: 11/15/2000  . Smokeless tobacco: Not on file  . Alcohol use No    FAMILY HISTORY:   Family History  Problem Relation Age of Onset  . Heart disease Father   . Breast cancer Maternal Aunt   . Diabetes Neg Hx   . Colon cancer Neg Hx   . Ovarian cancer Neg Hx     DRUG ALLERGIES:   Allergies  Allergen Reactions  . Sulfa Antibiotics Rash    REVIEW  OF SYSTEMS:   Review of Systems  Constitutional: Positive for chills and fever. Negative for malaise/fatigue.  HENT: Negative for congestion.   Eyes: Negative for blurred vision and double vision.  Respiratory: Negative for cough, shortness of breath and wheezing.   Cardiovascular: Negative for chest pain and leg swelling.  Gastrointestinal: Negative for abdominal pain, blood in stool, constipation, diarrhea, melena, nausea and vomiting.  Genitourinary: Negative for dysuria, frequency and urgency.  Musculoskeletal: Negative for joint pain.  Skin: Negative for itching and rash.  Neurological: Negative for dizziness, seizures, loss of consciousness, weakness and headaches.  Psychiatric/Behavioral: Negative for depression. The patient is not nervous/anxious.     MEDICATIONS AT HOME:   Prior to Admission medications   Medication Sig Start Date End Date Taking? Authorizing Provider  canagliflozin (INVOKANA) 300 MG TABS tablet Take 300 mg by mouth daily before breakfast.    Historical Provider, MD  cholecalciferol (VITAMIN D) 1000 UNITS tablet Take 1,000 Units by mouth daily.    Historical Provider, MD  diclofenac (VOLTAREN) 50 MG EC tablet  08/14/15   Historical Provider, MD  estradiol-levonorgestrel [facility]) 0.045-0.015 MG/DAY Place 1 patch onto the skin once a week. 09/17/15   Alanda Slim Defrancesco, MD  estrogen-methylTESTOSTERone (ESTRATEST) 1.25-2.5 MG tablet Take 1 tablet by mouth daily. 05/13/16   Alanda Slim  Defrancesco, MD  felodipine (PLENDIL) 5 MG 24 hr tablet Take by mouth.    Historical Provider, MD  fenofibrate 160 MG tablet  05/08/16   Historical Provider, MD  losartan (COZAAR) 25 MG tablet Take by mouth.    Historical Provider, MD  medroxyPROGESTERone (PROVERA) 5 MG tablet 1 tablet daily 05/13/16   Alanda Slim Defrancesco, MD  metFORMIN (GLUCOPHAGE) 500 MG tablet Take by mouth.    Historical Provider, MD  nebivolol (BYSTOLIC) 5 MG tablet Take by mouth.    Historical Provider, MD    pantoprazole (PROTONIX) 40 MG tablet  03/01/16   Historical Provider, MD  Potassium 99 MG TABS Take by mouth.    Historical Provider, MD  sertraline (ZOLOFT) 50 MG tablet Take 1 tablet (50 mg total) by mouth daily. 09/17/15   Alanda Slim Defrancesco, MD  Vitamin D, Ergocalciferol, (DRISDOL) 50000 units CAPS capsule  04/28/16   Historical Provider, MD      VITAL SIGNS:  Blood pressure 129/76, pulse 65, temperature 98 F (36.7 C), temperature source Oral, resp. rate 18, SpO2 95 %.  PHYSICAL EXAMINATION:  Physical Exam  GENERAL:  60 y.o.-year-old patient lying in the bed with no acute distress. Obese. EYES: Pupils equal, round, reactive to light and accommodation. No scleral icterus. Extraocular muscles intact.  HEENT: Head atraumatic, normocephalic. Oropharynx and nasopharynx clear. Moist oral mucosa. NECK:  Supple, no jugular venous distention. No thyroid enlargement, no tenderness.  LUNGS: Normal breath sounds bilaterally, no wheezing, rales,rhonchi or crepitation. No use of accessory muscles of respiration.  CARDIOVASCULAR: S1, S2 normal. No murmurs, rubs, or gallops.  ABDOMEN: Soft, nontender, nondistended. Bowel sounds present. No organomegaly or mass.  EXTREMITIES: No pedal edema, cyanosis, or clubbing.  NEUROLOGIC: Cranial nerves II through XII are intact. Muscle strength 5/5 in all extremities. Sensation intact. Gait not checked.  PSYCHIATRIC: The patient is alert and oriented x 3.  SKIN: No obvious rash, lesion, or ulcer.   LABORATORY PANEL:   CBC No results for input(s): WBC, HGB, HCT, PLT in the last 168 hours. ------------------------------------------------------------------------------------------------------------------  Chemistries  No results for input(s): NA, K, CL, CO2, GLUCOSE, BUN, CREATININE, CALCIUM, MG, AST, ALT, ALKPHOS, BILITOT in the last 168 hours.  Invalid input(s):  GFRCGP ------------------------------------------------------------------------------------------------------------------  Cardiac Enzymes No results for input(s): TROPONINI in the last 168 hours. ------------------------------------------------------------------------------------------------------------------  RADIOLOGY:  Dg Chest 2 View  Result Date: 08/11/2016 CLINICAL DATA:  Fever and chills.  Body aches EXAM: CHEST  2 VIEW COMPARISON:  03/21/2010 FINDINGS: The heart size and mediastinal contours are within normal limits. Both lungs are clear. The visualized skeletal structures are unremarkable. IMPRESSION: No active cardiopulmonary disease. Electronically Signed   By: Kerby Moors M.D.   On: 08/11/2016 16:13      IMPRESSION AND PLAN:  Sepsis with bacteremia. The patient is directly admitted to medical floor. I will start vancomycin pharmacy to dose. Follow-up CBC and blood culture report. We will repeat blood culture if the patient has fever again. ID consult.  History of diverticulosis and recent possible diverticulitis. She received antibiotics 2 weeks ago.  Diabetes. Start sliding scale.  All the records are reviewed and case discussed with ED provider. Management plans discussed with the patient, Her husband and they are in agreement.  CODE STATUS: Full code  TOTAL TIME TAKING CARE OF THIS PATIENT: 55 minutes.    Demetrios Loll M.D on 08/13/2016 at 12:41 PM  Between 7am to 6pm - Pager - [phone]  After 6pm go to [email or web address] - password [email or web address]  [facility]  Office  [phone]  CC: Primary care physician; Perrin Maltese, MD   Note: This dictation was prepared with Dragon dictation along with smaller phrase technology. Any transcriptional errors that result from this process are unintentional.

## 2016-08-13 NOTE — Progress Notes (Signed)
ANTIBIOTIC CONSULT NOTE - INITIAL  Pharmacy Consult for Linezolid  Indication: bacteremia  Allergies  Allergen Reactions  . Vancomycin Hives  . Sulfa Antibiotics Rash    Patient Measurements: Height: 5\' 5"  (165.1 cm) Weight: 250 lb 8 oz (113.6 kg) IBW/kg (Calculated) : 57 Adjusted Body Weight:   Vital Signs: Temp: 98.8 F (37.1 C) (09/29 1939) Temp Source: Oral (09/29 1939) BP: 122/66 (09/29 1939) Pulse Rate: 57 (09/29 1939) Intake/Output from previous day: No intake/output data recorded. Intake/Output from this shift: Total I/O In: 220 [P.O.:220] Out: -   Labs:  Recent Labs  08/13/16 1411  WBC 6.9  HGB 14.7  PLT 335  CREATININE 0.76   Estimated Creatinine Clearance: 96.3 mL/min (by C-G formula based on SCr of 0.76 mg/dL). No results for input(s): VANCOTROUGH, VANCOPEAK, VANCORANDOM, GENTTROUGH, GENTPEAK, GENTRANDOM, TOBRATROUGH, TOBRAPEAK, TOBRARND, AMIKACINPEAK, AMIKACINTROU, AMIKACIN in the last 72 hours.   Microbiology: No results found for this or any previous visit (from the past 720 hour(s)).  Medical History: Past Medical History:  Diagnosis Date  . Abnormal uterine bleeding (AUB)   . Anxiety   . DM (diabetes mellitus) ([place])   . GERD (gastroesophageal reflux disease)   . HTN (hypertension)   . Increased BMI   . Perimenopausal   . Vaginal Pap smear, abnormal     Medications:  Prescriptions Prior to Admission  Medication Sig Dispense Refill Last Dose  . canagliflozin (INVOKANA) 300 MG TABS tablet Take 300 mg by mouth daily before breakfast.   08/13/2016 at Unknown time  . cholecalciferol (VITAMIN D) 1000 UNITS tablet Take 1,000 Units by mouth daily.   08/13/2016 at Unknown time  . diclofenac (VOLTAREN) 50 MG EC tablet    08/13/2016 at Unknown time  . estradiol-levonorgestrel (CLIMARAPRO) 0.045-0.015 MG/DAY Place 1 patch onto the skin once a week. 12 patch 3 08/13/2016 at Unknown time  . felodipine (PLENDIL) 5 MG 24 hr tablet Take by mouth.    08/13/2016 at Unknown time  . fenofibrate 160 MG tablet    08/13/2016 at Unknown time  . losartan (COZAAR) 25 MG tablet Take by mouth.   08/13/2016 at Unknown time  . metFORMIN (GLUCOPHAGE) 500 MG tablet Take by mouth.   08/13/2016 at Unknown time  . nebivolol (BYSTOLIC) 5 MG tablet Take by mouth.   08/13/2016 at Unknown time  . pantoprazole (PROTONIX) 40 MG tablet    08/13/2016 at Unknown time  . Potassium 99 MG TABS Take by mouth.   08/13/2016 at Unknown time  . sertraline (ZOLOFT) 50 MG tablet Take 1 tablet (50 mg total) by mouth daily. 90 tablet 3 08/13/2016 at Unknown time  . Vitamin D, Ergocalciferol, (DRISDOL) 50000 units CAPS capsule    08/13/2016 at Unknown time  . estrogen-methylTESTOSTERone (ESTRATEST) 1.25-2.5 MG tablet Take 1 tablet by mouth daily. (Patient not taking: Reported on 08/13/2016) 90 tablet 1 Not Taking  . medroxyPROGESTERone (PROVERA) 5 MG tablet 1 tablet daily (Patient not taking: Reported on 08/13/2016) 90 tablet 1 Not Taking   Assessment: Pt growing Staph species in blood Cx.  Vancomycin was originally ordered but pt developed severe rxn during initial infusion.  Infusion was stopped.  Spoke with Dr Bridgett Larsson, explained rxn may just be red man's syndrome.   Dr Bridgett Larsson wished to d/c vancomycin and begin Zyvox.  Reminded Dr Bridgett Larsson this would require ID approval.  Dr Bridgett Larsson states that would be ok since he already had an ID consult on the pt.    Paged Dr Ola Spurr twice ,  no response.   No note from Dr Ola Spurr or abx ordered as of 9/29 @ 22:00 .    Goal of Therapy:  resolution of infection  Plan:  Expected duration 7 days with resolution of temperature and/or normalization of WBC   Will order Zyvox 600 mg IV X 1 to be given on 9/29 PM.  Will need to contact Dr Ola Spurr on 9/30 to get ID approval.   Aretha Levi D 08/13/2016,9:50 PM

## 2016-08-13 NOTE — Consult Note (Signed)
[facility] Infectious Disease     Reason for Consult Bacteremia  Referring Physician: Bridgett Larsson Date of Admission:  08/13/2016   Active Problems:   Sepsis [facility])   HPI: Gloria Wilkins is a 60 y.o. female with recent episdoe of presumed diverticulitis admitted from home after a bcx done at pcp office turned + 1/2 for Gpc in clusters. She has had no skin or soft tissue infections recently but had woken up the day she went to PCP with chills and fatigue.  Currently no fevers chills, sweats.  Past Medical History:  Diagnosis Date  . Abnormal uterine bleeding (AUB)   . Anxiety   . DM (diabetes mellitus) ([place])   . GERD (gastroesophageal reflux disease)   . HTN (hypertension)   . Increased BMI   . Perimenopausal   . Vaginal Pap smear, abnormal    Past Surgical History:  Procedure Laterality Date  . ablation     endometrial  . CERVICAL DISCECTOMY    . KNEE SURGERY Left   . LEEP    . TUBAL LIGATION     Social History  Substance Use Topics  . Smoking status: Former Smoker    Quit date: 11/15/2000  . Smokeless tobacco: Not on file  . Alcohol use No   Family History  Problem Relation Age of Onset  . Heart disease Father   . Breast cancer Maternal Aunt   . Diabetes Neg Hx   . Colon cancer Neg Hx   . Ovarian cancer Neg Hx     Allergies:  Allergies  Allergen Reactions  . Vancomycin Hives  . Sulfa Antibiotics Rash    Current antibiotics: Antibiotics Given (last 72 hours)    Date/Time Action Medication Dose Rate   08/13/16 1419 Given   vancomycin (VANCOCIN) IVPB 1000 mg/200 mL premix 1,000 mg 200 mL/hr      MEDICATIONS: . heparin  5,000 Units Subcutaneous Q8H  . insulin aspart  0-5 Units Subcutaneous QHS  . insulin aspart  0-9 Units Subcutaneous TID WC    Review of Systems - 11 systems reviewed and negative per HPI   OBJECTIVE: Temp:  [97.9 F (36.6 C)-98 F (36.7 C)] 97.9 F (36.6 C) (09/29 1507) Pulse Rate:  [58-65] 58 (09/29 1507) Resp:  [18] 18 (09/29  1507) BP: (129-141)/(76-80) 141/80 (09/29 1507) SpO2:  [95 %-97 %] 97 % (09/29 1507) Weight:  [113.6 kg (250 lb 8 oz)] 113.6 kg (250 lb 8 oz) (09/29 1505)  Physical Exam  Constitutional:  oriented to person, place, and time. appears well-developed and well-nourished. No distress.  HENT: [place]/AT, PERRLA, no scleral icterus Mouth/Throat: Oropharynx is clear and moist. No oropharyngeal exudate.  Cardiovascular: Normal rate, regular rhythm and normal heart sounds. Exam reveals no gallop and no friction rub.  No murmur heard.  Pulmonary/Chest: Effort normal and breath sounds normal. No respiratory distress.  has no wheezes.  Neck = supple, no nuchal rigidity Abdominal: Soft. Bowel sounds are normal.  exhibits no distension. There is no tenderness.  Lymphadenopathy: no cervical adenopathy. No axillary adenopathy Neurological: alert and oriented to person, place, and time.  Skin: Skin is warm and dry. No rash noted. No erythema.  Psychiatric: a normal mood and affect.  behavior is normal.   LABS: Results for orders placed or performed during the hospital encounter of 08/13/16 (from the past 48 hour(s))  Glucose, capillary     Status: Abnormal   Collection Time: 08/13/16 11:58 AM  Result Value Ref Range   Glucose-Capillary 137 (  H) 65 - 99 mg/dL   Comment 1 Notify RN   Comprehensive metabolic panel     Status: Abnormal   Collection Time: 08/13/16  2:11 PM  Result Value Ref Range   Sodium 135 135 - 145 mmol/L   Potassium 4.3 3.5 - 5.1 mmol/L   Chloride 104 101 - 111 mmol/L   CO2 24 22 - 32 mmol/L   Glucose, Bld 129 (H) 65 - 99 mg/dL   BUN 15 6 - 20 mg/dL   Creatinine, Ser 0.76 0.44 - 1.00 mg/dL   Calcium 8.9 8.9 - 10.3 mg/dL   Total Protein 7.5 6.5 - 8.1 g/dL   Albumin 3.7 3.5 - 5.0 g/dL   AST 22 15 - 41 U/L   ALT 16 14 - 54 U/L   Alkaline Phosphatase 69 38 - 126 U/L   Total Bilirubin 0.3 0.3 - 1.2 mg/dL   GFR calc non Af Amer >60 >60 mL/min   GFR calc Af Amer >60 >60 mL/min     Comment: (NOTE) The eGFR has been calculated using the CKD EPI equation. This calculation has not been validated in all clinical situations. eGFR's persistently <60 mL/min signify possible Chronic Kidney Disease.    Anion gap 7 5 - 15  Magnesium     Status: None   Collection Time: 08/13/16  2:11 PM  Result Value Ref Range   Magnesium 2.1 1.7 - 2.4 mg/dL  CBC WITH DIFFERENTIAL     Status: Abnormal   Collection Time: 08/13/16  2:11 PM  Result Value Ref Range   WBC 6.9 3.6 - 11.0 K/uL   RBC 4.87 3.80 - 5.20 MIL/uL   Hemoglobin 14.7 12.0 - 16.0 g/dL   HCT 42.6 35.0 - 47.0 %   MCV 87.4 80.0 - 100.0 fL   MCH 30.2 26.0 - 34.0 pg   MCHC 34.5 32.0 - 36.0 g/dL   RDW 14.8 (H) 11.5 - 14.5 %   Platelets 335 150 - 440 K/uL   Neutrophils Relative % 59 %   Neutro Abs 4.0 1.4 - 6.5 K/uL   Lymphocytes Relative 30 %   Lymphs Abs 2.1 1.0 - 3.6 K/uL   Monocytes Relative 8 %   Monocytes Absolute 0.6 0.2 - 0.9 K/uL   Eosinophils Relative 2 %   Eosinophils Absolute 0.2 0 - 0.7 K/uL   Basophils Relative 1 %   Basophils Absolute 0.1 0 - 0.1 K/uL   No components found for: ESR, C REACTIVE PROTEIN MICRO: No results found for this or any previous visit (from the past 720 hour(s)).  IMAGING: Dg Chest 2 View  Result Date: 08/11/2016 CLINICAL DATA:  Fever and chills.  Body aches EXAM: CHEST  2 VIEW COMPARISON:  03/21/2010 FINDINGS: The heart size and mediastinal contours are within normal limits. Both lungs are clear. The visualized skeletal structures are unremarkable. IMPRESSION: No active cardiopulmonary disease. Electronically Signed   By: Kerby Moors M.D.   On: 08/11/2016 16:13    Assessment:   Gloria Wilkins is a 60 y.o. female admitted with GPC in clusters on [place] 1/2 done for chills and fatigue at PCP office.  Repeat bcx done here pending and wbc nml, no fevers. I suspect contaminant but could be staph aureus  Recommendations Cont vanco Follow outpt BCX.  If Coag neg staph then can  dc home off abx If Staph aureus will need to cont vanco, check echo and will need picc placed once bcx neg to treat for 2 weeks.  Thank you very much for allowing me to participate in the care of this patient. Please call with questions.   Ruford Dudzinski P. Treyveon Mochizuki, MD  

## 2016-08-13 NOTE — Progress Notes (Signed)
Pharmacy aware of patients medications not completed prior to arrival to floor. Medication tech Kayla aware unable to complete. Nursing supervisor Lelon Frohlich notified and charge nurse Helene Kelp. Medication modified.

## 2016-08-13 NOTE — Progress Notes (Signed)
Labs from patients primary doctor faxed to floor. Made Dr Bridgett Larsson aware.

## 2016-08-14 ENCOUNTER — Encounter: Payer: Self-pay | Admitting: Cardiology

## 2016-08-14 LAB — GASTROINTESTINAL PANEL BY PCR, STOOL (REPLACES STOOL CULTURE)
ADENOVIRUS F40/41: NOT DETECTED
ASTROVIRUS: NOT DETECTED
CAMPYLOBACTER SPECIES: NOT DETECTED
CYCLOSPORA CAYETANENSIS: NOT DETECTED
Cryptosporidium: NOT DETECTED
E. COLI O157: NOT DETECTED
ENTEROPATHOGENIC E COLI (EPEC): NOT DETECTED
ENTEROTOXIGENIC E COLI (ETEC): NOT DETECTED
Entamoeba histolytica: NOT DETECTED
Enteroaggregative E coli (EAEC): NOT DETECTED
Giardia lamblia: NOT DETECTED
Norovirus GI/GII: NOT DETECTED
PLESIMONAS SHIGELLOIDES: NOT DETECTED
ROTAVIRUS A: NOT DETECTED
SAPOVIRUS (I, II, IV, AND V): NOT DETECTED
SHIGA LIKE TOXIN PRODUCING E COLI (STEC): NOT DETECTED
Salmonella species: NOT DETECTED
Shigella/Enteroinvasive E coli (EIEC): NOT DETECTED
Vibrio cholerae: NOT DETECTED
Vibrio species: NOT DETECTED
Yersinia enterocolitica: NOT DETECTED

## 2016-08-14 LAB — GLUCOSE, CAPILLARY
GLUCOSE-CAPILLARY: 109 mg/dL — AB (ref 65–99)
GLUCOSE-CAPILLARY: 82 mg/dL (ref 65–99)
GLUCOSE-CAPILLARY: 103 mg/dL — AB (ref 65–99)
Glucose-Capillary: 83 mg/dL (ref 65–99)

## 2016-08-14 LAB — C DIFFICILE QUICK SCREEN W PCR REFLEX
C DIFFICILE (CDIFF) TOXIN: NEGATIVE
C DIFFICLE (CDIFF) ANTIGEN: NEGATIVE
C Diff interpretation: NOT DETECTED

## 2016-08-14 MED ORDER — DIPHENHYDRAMINE HCL 25 MG PO CAPS
25.0000 mg | ORAL_CAPSULE | Freq: Once | ORAL | Status: AC
Start: 2016-08-14 — End: 2016-08-14
  Administered 2016-08-14: 25 mg via ORAL
  Filled 2016-08-14: qty 1

## 2016-08-14 MED ORDER — FLUCONAZOLE 100 MG PO TABS
150.0000 mg | ORAL_TABLET | Freq: Once | ORAL | Status: AC
  Administered 2016-08-14: 150 mg via ORAL
  Filled 2016-08-14: qty 2

## 2016-08-14 MED ORDER — LINEZOLID 600 MG/300ML IV SOLN
600.0000 mg | Freq: Two times a day (BID) | INTRAVENOUS | Status: DC
  Filled 2016-08-14 (×2): qty 300

## 2016-08-14 MED ORDER — DIPHENOXYLATE-ATROPINE 2.5-0.025 MG/5ML PO LIQD
5.0000 mL | Freq: Four times a day (QID) | ORAL | Status: DC | PRN
  Administered 2016-08-14: 5 mL via ORAL
  Filled 2016-08-14: qty 5

## 2016-08-14 MED ORDER — LINEZOLID 600 MG PO TABS
600.0000 mg | ORAL_TABLET | Freq: Two times a day (BID) | ORAL | Status: DC
  Administered 2016-08-14 – 2016-08-15 (×3): 600 mg via ORAL
  Filled 2016-08-14 (×3): qty 1

## 2016-08-14 NOTE — Progress Notes (Addendum)
Gloria Wilkins NAME: Gloria Wilkins    MR#:  [id number]  DATE OF BIRTH:  [birth year]  SUBJECTIVE:  Came in with low grade fever and positive BC as outpt  REVIEW OF SYSTEMS:   Review of Systems  Constitutional: Negative for chills, fever and weight loss.  HENT: Negative for ear discharge, ear pain and nosebleeds.   Eyes: Negative for blurred vision, pain and discharge.  Respiratory: Negative for sputum production, shortness of breath, wheezing and stridor.   Cardiovascular: Negative for chest pain, palpitations, orthopnea and PND.  Gastrointestinal: Negative for abdominal pain, diarrhea, nausea and vomiting.  Genitourinary: Negative for frequency and urgency.  Musculoskeletal: Negative for back pain and joint pain.  Neurological: Negative for sensory change, speech change, focal weakness and weakness.  Psychiatric/Behavioral: Negative for depression and hallucinations. The patient is not nervous/anxious.    Tolerating Diet:yes Tolerating PT: not needed  DRUG ALLERGIES:   Allergies  Allergen Reactions  . Vancomycin Hives  . Sulfa Antibiotics Rash    VITALS:  Blood pressure 118/70, pulse (!) 52, temperature 98 F (36.7 C), temperature source Oral, resp. rate 18, height 5\' 5"  (1.651 m), weight 113.6 kg (250 lb 8 oz), SpO2 95 %.  PHYSICAL EXAMINATION:   Physical Exam  GENERAL:  60 y.o.-year-old patient lying in the bed with no acute distress.  EYES: Pupils equal, round, reactive to light and accommodation. No scleral icterus. Extraocular muscles intact.  HEENT: Head atraumatic, normocephalic. Oropharynx and nasopharynx clear.  NECK:  Supple, no jugular venous distention. No thyroid enlargement, no tenderness.  LUNGS: Normal breath sounds bilaterally, no wheezing, rales, rhonchi. No use of accessory muscles of respiration.  CARDIOVASCULAR: S1, S2 normal. No murmurs, rubs, or gallops.  ABDOMEN: Soft, nontender,  nondistended. Bowel sounds present. No organomegaly or mass.  EXTREMITIES: No cyanosis, clubbing or edema b/l.    NEUROLOGIC: Cranial nerves II through XII are intact. No focal Motor or sensory deficits b/l.   PSYCHIATRIC:  patient is alert and oriented x 3.  SKIN: No obvious rash, lesion, or ulcer.   LABORATORY PANEL:  CBC  Recent Labs Lab 08/13/16 1411  WBC 6.9  HGB 14.7  HCT 42.6  PLT 335    Chemistries   Recent Labs Lab 08/13/16 1411  NA 135  K 4.3  CL 104  CO2 24  GLUCOSE 129*  BUN 15  CREATININE 0.76  CALCIUM 8.9  MG 2.1  AST 22  ALT 16  ALKPHOS 52  BILITOT 0.3    ASSESSMENT AND PLAN:  Gloria Wilkins  is a 60 y.o. female with a known history of HTN, DM, Diverticulosis and diverticulitis. The patient has history of diverticulosis and had abdominal pain 2 weeks ago. She was suspected to have diverticulitis and was treated with antibiotics. The patient has fever and chills yesterday, went to see her primary care physician. Chest x-ray and urinalysis are negative. But she has fever 100,  leukocytosis at 14,000.  blood culture showed gram-positive cocci  1.GPC 1/2 Sepsis  -developed Red man's syndrome with IV vanc. Changed to po linezolid -wbc normal -no fever -source??  2.History of diverticulosis and recent possible diverticulitis. She received antibiotics 2 weeks ago. GI panel PCR negative  3.Diabetes. On sliding scale. Hold metformin. Sugars really good!!  Case discussed with Care Management/Social Worker. Management plans discussed with the patient, family and they are in agreement.  CODE STATUS: full DVT Prophylaxis: lovenox TOTAL TIME TAKING CARE OF THIS PATIENT:  30 minutes.  >50% time spent on counselling and coordination of care  POSSIBLE D/C IN 1-2 DAYS, DEPENDING ON CLINICAL CONDITION.  Note: This dictation was prepared with Dragon dictation along with smaller phrase technology. Any transcriptional errors that result from this process are  unintentional.  Naitik Hermann M.D on 08/14/2016 at 3:46 PM  Between 7am to 6pm - Pager - [phone]  After 6pm go to [email or web address] - password [email or web address] [facility]  Office  [phone]  CC: Primary care physician; Perrin Maltese, MD

## 2016-08-15 NOTE — Progress Notes (Signed)
Gloria Wilkins to be D/C'd Home per MD order.  Discussed with the patient and all questions fully answered.  VSS, Skin clean, dry and intact without evidence of skin break down, no evidence of skin tears noted. IV catheter discontinued intact. Site without signs and symptoms of complications. Dressing and pressure applied.  An After Visit Summary was printed and given to the patient. Patient received prescription.  D/c education completed with patient/family including follow up instructions, medication list, d/c activities limitations if indicated, with other d/c instructions as indicated by MD - patient able to verbalize understanding, all questions fully answered.   Patient instructed to return to ED, call 911, or call MD for any changes in condition.   Patient ambulated to private auto.  Threasa Beards [name] 08/15/2016 11:02 AM

## 2016-08-15 NOTE — Discharge Summary (Signed)
[facility] [place] NAME: Gloria Wilkins    MR#:  [id number]  DATE OF BIRTH:  07/25/1956  DATE OF ADMISSION:  08/13/2016 ADMITTING PHYSICIAN: Demetrios Loll, MD  DATE OF DISCHARGE: 08/15/16  PRIMARY CARE PHYSICIAN: Perrin Maltese, MD    ADMISSION DIAGNOSIS:  Sepsis  DISCHARGE DIAGNOSIS:  Staph coagulase positive BC -etio unclear HTN DM-2  SECONDARY DIAGNOSIS:   Past Medical History:  Diagnosis Date  . Abnormal uterine bleeding (AUB)   . Anxiety   . DM (diabetes mellitus) ([place])   . GERD (gastroesophageal reflux disease)   . HTN (hypertension)   . Increased BMI   . Perimenopausal   . Vaginal Pap smear, abnormal     HOSPITAL COURSE:   [name], DM, Diverticulosis and diverticulitis. The patient has history of diverticulosis and had abdominal pain 2 weeks ago. She was suspected to have diverticulitis and was treated with antibiotics. The patient has fever and chills yesterday, went to see her primary care physician.Chest x-ray and urinalysis are negative. But she has fever 100, leukocytosis at 14,000. blood culture showed gram-positive cocci  1.Staph coagulase neg Sepsis  -etio unclear -final results obtained. No need for abxs per ID rec -f/u ID in 10 days-pt advised to make appt -d/c linezolid -no fever, wbc normal BC here negative -developed Red man's syndrome with IV vanc. -wbc normal -no fever  2.History of diverticulosis and recent possible diverticulitis. She received antibiotics 2 weeks ago. GI panel PCR negative, c diff negative  3.Diabetes. On sliding scale. Resume home meds  Doing well D/c home CONSULTS OBTAINED:  Treatment Team:  Leonel Ramsay, MD  DRUG ALLERGIES:   Allergies  Allergen Reactions  . Vancomycin Hives  . Sulfa Antibiotics Rash    DISCHARGE MEDICATIONS:   Current Discharge Medication List    CONTINUE these  medications which have NOT CHANGED   Details  canagliflozin (INVOKANA) 300 MG TABS tablet Take 300 mg by mouth daily before breakfast.    cholecalciferol (VITAMIN D) 1000 UNITS tablet Take 1,000 Units by mouth daily.    diclofenac (VOLTAREN) 50 MG EC tablet     estradiol-levonorgestrel (CLIMARAPRO) 0.045-0.015 MG/DAY Place 1 patch onto the skin once a week. Qty: 12 patch, Refills: 3    felodipine (PLENDIL) 5 MG 24 hr tablet Take by mouth.    fenofibrate 160 MG tablet     losartan (COZAAR) 25 MG tablet Take by mouth.    metFORMIN (GLUCOPHAGE) 500 MG tablet Take by mouth.    nebivolol (BYSTOLIC) 5 MG tablet Take by mouth.    pantoprazole (PROTONIX) 40 MG tablet     Potassium 99 MG TABS Take by mouth.    sertraline (ZOLOFT) 50 MG tablet Take 1 tablet (50 mg total) by mouth daily. Qty: 90 tablet, Refills: 3    Vitamin D, Ergocalciferol, (DRISDOL) 50000 units CAPS capsule     estrogen-methylTESTOSTERone (ESTRATEST) 1.25-2.5 MG tablet Take 1 tablet by mouth daily. Qty: 90 tablet, Refills: 1    medroxyPROGESTERone (PROVERA) 5 MG tablet 1 tablet daily Qty: 90 tablet, Refills: 1        If you experience worsening of your admission symptoms, develop shortness of breath, life threatening emergency, suicidal or homicidal thoughts you must seek medical attention immediately by calling 911 or calling your MD immediately  if symptoms less severe.  You Must read complete instructions/literature along with all the possible adverse reactions/side effects for all  the Medicines you take and that have been prescribed to you. Take any new Medicines after you have completely understood and accept all the possible adverse reactions/side effects.   Please note  You were cared for by a hospitalist during your hospital stay. If you have any questions about your discharge medications or the care you received while you were in the hospital after you are discharged, you can call the unit and asked to  speak with the hospitalist on call if the hospitalist that took care of you is not available. Once you are discharged, your primary care physician will handle any further medical issues. Please note that NO REFILLS for any discharge medications will be authorized once you are discharged, as it is imperative that you return to your primary care physician (or establish a relationship with a primary care physician if you do not have one) for your aftercare needs so that they can reassess your need for medications and monitor your lab values. Today   SUBJECTIVE   No complaints  VITAL SIGNS:  Blood pressure 108/72, pulse (!) 55, temperature 98.1 F (36.7 C), temperature source Oral, resp. rate 18, height 5\' 5"  (1.651 m), weight 113.6 kg (250 lb 8 oz), SpO2 97 %.  I/O:   Intake/Output Summary (Last 24 hours) at 08/15/16 0932 Last data filed at 08/15/16 0800  Gross per 24 hour  Intake             1680 ml  Output             1600 ml  Net               80 ml    PHYSICAL EXAMINATION:  GENERAL:  60 y.o.-year-old patient lying in the bed with no acute distress.  EYES: Pupils equal, round, reactive to light and accommodation. No scleral icterus. Extraocular muscles intact.  HEENT: Head atraumatic, normocephalic. Oropharynx and nasopharynx clear.  NECK:  Supple, no jugular venous distention. No thyroid enlargement, no tenderness.  LUNGS: Normal breath sounds bilaterally, no wheezing, rales,rhonchi or crepitation. No use of accessory muscles of respiration.  CARDIOVASCULAR: S1, S2 normal. No murmurs, rubs, or gallops.  ABDOMEN: Soft, non-tender, non-distended. Bowel sounds present. No organomegaly or mass.  EXTREMITIES: No pedal edema, cyanosis, or clubbing.  NEUROLOGIC: Cranial nerves II through XII are intact. Muscle strength 5/5 in all extremities. Sensation intact. Gait not checked.  PSYCHIATRIC: The patient is alert and oriented x 3.  SKIN: No obvious rash, lesion, or ulcer.   DATA REVIEW:    CBC   Recent Labs Lab 08/13/16 1411  WBC 6.9  HGB 14.7  HCT 42.6  PLT 335    Chemistries   Recent Labs Lab 08/13/16 1411  NA 135  K 4.3  CL 104  CO2 24  GLUCOSE 129*  BUN 15  CREATININE 0.76  CALCIUM 8.9  MG 2.1  AST 22  ALT 16  ALKPHOS 69  BILITOT 0.3    Microbiology Results   Recent Results (from the past 240 hour(s))  CULTURE, BLOOD (ROUTINE X 2) w Reflex to ID Panel     Status: None (Preliminary result)   Collection Time: 08/13/16  2:11 PM  Result Value Ref Range Status   Specimen Description BLOOD LEFT AC  Final   Special Requests BOTTLES DRAWN AEROBIC AND ANAEROBIC 7ML  Final   Culture NO GROWTH 2 DAYS  Final   Report Status PENDING  Incomplete  CULTURE, BLOOD (ROUTINE X 2) w Reflex to ID  Panel     Status: None (Preliminary result)   Collection Time: 08/13/16  2:11 PM  Result Value Ref Range Status   Specimen Description BLOOD RIGHT AC  Final   Special Requests BOTTLES DRAWN AEROBIC AND ANAEROBIC  10ML  Final   Culture NO GROWTH 2 DAYS  Final   Report Status PENDING  Incomplete  Gastrointestinal Panel by PCR , Stool     Status: None   Collection Time: 08/14/16 11:08 AM  Result Value Ref Range Status   Campylobacter species NOT DETECTED NOT DETECTED Final   Plesimonas shigelloides NOT DETECTED NOT DETECTED Final   Salmonella species NOT DETECTED NOT DETECTED Final   Yersinia enterocolitica NOT DETECTED NOT DETECTED Final   Vibrio species NOT DETECTED NOT DETECTED Final   Vibrio cholerae NOT DETECTED NOT DETECTED Final   Enteroaggregative E coli (EAEC) NOT DETECTED NOT DETECTED Final   Enteropathogenic E coli (EPEC) NOT DETECTED NOT DETECTED Final   Enterotoxigenic E coli (ETEC) NOT DETECTED NOT DETECTED Final   Shiga like toxin producing E coli (STEC) NOT DETECTED NOT DETECTED Final   E. coli O157 NOT DETECTED NOT DETECTED Final   Shigella/Enteroinvasive E coli (EIEC) NOT DETECTED NOT DETECTED Final   Cryptosporidium NOT DETECTED NOT DETECTED  Final   Cyclospora cayetanensis NOT DETECTED NOT DETECTED Final   Entamoeba histolytica NOT DETECTED NOT DETECTED Final   Giardia lamblia NOT DETECTED NOT DETECTED Final   Adenovirus F40/41 NOT DETECTED NOT DETECTED Final   Astrovirus NOT DETECTED NOT DETECTED Final   Norovirus GI/GII NOT DETECTED NOT DETECTED Final   Rotavirus A NOT DETECTED NOT DETECTED Final   Sapovirus (I, II, IV, and V) NOT DETECTED NOT DETECTED Final  C difficile quick scan w PCR reflex     Status: None   Collection Time: 08/14/16  3:11 PM  Result Value Ref Range Status   C Diff antigen NEGATIVE NEGATIVE Final   C Diff toxin NEGATIVE NEGATIVE Final   C Diff interpretation No C. difficile detected.  Final    RADIOLOGY:  No results found.   Management plans discussed with the patient, family and they are in agreement.  CODE STATUS:     Code Status Orders        Start     Ordered   08/13/16 1223  Full code  Continuous     08/13/16 1222    Code Status History    Date Active Date Inactive Code Status Order ID Comments User Context   This patient has a current code status but no historical code status.      TOTAL TIME TAKING CARE OF THIS PATIENT: 40 minutes.    Kennah Hehr M.D on 08/15/2016 at 9:32 AM  Between 7am to 6pm - Pager - [phone] After 6pm go to [email or web address] - password [email or web address] [facility]  Office  [phone]  CC: Primary care physician; Perrin Maltese, MD

## 2016-08-16 LAB — GLUCOSE, CAPILLARY: Glucose-Capillary: 110 mg/dL — ABNORMAL HIGH (ref 65–99)

## 2016-08-18 LAB — CULTURE, BLOOD (ROUTINE X 2)
CULTURE: NO GROWTH
Culture: NO GROWTH

## 2016-09-20 NOTE — Progress Notes (Signed)
Patient ID: Gloria Wilkins, female   DOB: 1956-10-02, 60 y.o.   MRN: [id number] [facility] PREVENTATIVE CARE GYN  ENCOUNTER NOTE  Subjective:       Gloria Wilkins is a 60 y.o. [phone] female here for a routine annual gynecologic exam.  Current complaints: 1.  D/c hrt- wants bio identical   Slight itch on outside- no d/c noted  Colonoscopy 2016  demonstrated 15 polyps, a few adenomatous; repeat colonoscopy in 2 years.  Is planned.   Gynecologic History No LMP recorded. Patient has had an ablation. Contraception: ablation Last Pap: 09/03/2014 neg/neg. Results were: normal Last mammogram: 2015. Results were: normal  Obstetric History OB History  Gravida Para Term Preterm AB Living  2 2 2     2   SAB TAB Ectopic Multiple Live Births          2    # Outcome Date GA Lbr Len/2nd Weight Sex Delivery Anes PTL Lv  2 Term 1980     CS-LTranv   LIV  1 Term 1979     Vag-Spont   LIV      Past Medical History:  Diagnosis Date  . Abnormal uterine bleeding (AUB)   . Anxiety   . DM (diabetes mellitus) ([place])   . GERD (gastroesophageal reflux disease)   . HTN (hypertension)   . Increased BMI   . Perimenopausal   . Vaginal Pap smear, abnormal     Past Surgical History:  Procedure Laterality Date  . ablation     endometrial  . CERVICAL DISCECTOMY    . KNEE SURGERY Left   . LEEP    . TUBAL LIGATION      Current Outpatient Prescriptions on File Prior to Visit  Medication Sig Dispense Refill  . canagliflozin (INVOKANA) 300 MG TABS tablet Take 300 mg by mouth daily before breakfast.    . cholecalciferol (VITAMIN D) 1000 UNITS tablet Take 1,000 Units by mouth daily.    . diclofenac (VOLTAREN) 50 MG EC tablet     . estradiol-levonorgestrel (CLIMARAPRO) 0.045-0.015 MG/DAY Place 1 patch onto the skin once a week. 12 patch 3  . estrogen-methylTESTOSTERone (ESTRATEST) 1.25-2.5 MG tablet Take 1 tablet by mouth daily. (Patient not taking: Reported on 08/13/2016) 90 tablet 1  . felodipine  (PLENDIL) 5 MG 24 hr tablet Take by mouth.    . fenofibrate 160 MG tablet     . losartan (COZAAR) 25 MG tablet Take by mouth.    . medroxyPROGESTERone (PROVERA) 5 MG tablet 1 tablet daily (Patient not taking: Reported on 08/13/2016) 90 tablet 1  . metFORMIN (GLUCOPHAGE) 500 MG tablet Take by mouth.    . nebivolol (BYSTOLIC) 5 MG tablet Take by mouth.    . pantoprazole (PROTONIX) 40 MG tablet     . Potassium 99 MG TABS Take by mouth.    . sertraline (ZOLOFT) 50 MG tablet Take 1 tablet (50 mg total) by mouth daily. 90 tablet 3  . Vitamin D, Ergocalciferol, (DRISDOL) 50000 units CAPS capsule      No current facility-administered medications on file prior to visit.     Allergies  Allergen Reactions  . Vancomycin Hives  . Sulfa Antibiotics Rash    Social History   Social History  . Marital status: Married    Spouse name: N/A  . Number of children: N/A  . Years of education: N/A   Occupational History  . Not on file.   Social History Main Topics  . Smoking status: Former Smoker  Quit date: 11/15/2000  . Smokeless tobacco: Former Systems developer  . Alcohol use No  . Drug use: No  . Sexual activity: Yes    Birth control/ protection: Surgical   Other Topics Concern  . Not on file   Social History Narrative  . No narrative on file    Family History  Problem Relation Age of Onset  . Heart disease Father   . Breast cancer Maternal Aunt   . Diabetes Neg Hx   . Colon cancer Neg Hx   . Ovarian cancer Neg Hx     The following portions of the patient's history were reviewed and updated as appropriate: allergies, current medications, past family history, past medical history, past social history, past surgical history and problem list.  Review of Systems ROS Review of Systems - General ROS: negative for - chills, fatigue, fever, hot flashes, night sweats, weight gain or weight loss Psychological ROS: negative for - anxiety, decreased libido, depression, mood swings, physical abuse or  sexual abuse Ophthalmic ROS: negative for - blurry vision, eye pain or loss of vision ENT ROS: negative for - headaches, hearing change, visual changes or vocal changes Allergy and Immunology ROS: negative for - hives, itchy/watery eyes or seasonal allergies Hematological and Lymphatic ROS: negative for - bleeding problems, bruising, swollen lymph nodes or weight loss Endocrine ROS: negative for - galactorrhea, hair pattern changes, hot flashes, malaise/lethargy, mood swings, palpitations, polydipsia/polyuria, skin changes, temperature intolerance or unexpected weight changes Breast ROS: negative for - new or changing breast lumps or nipple discharge Respiratory ROS: negative for - cough or shortness of breath Cardiovascular ROS: negative for - chest pain, irregular heartbeat, palpitations or shortness of breath Gastrointestinal ROS: no abdominal pain, change in bowel habits, or black or bloody stools Genito-Urinary ROS: no dysuria, trouble voiding, or hematuria Musculoskeletal ROS: negative for - joint pain or joint stiffness Neurological ROS: negative for - bowel and bladder control changes Dermatological ROS: negative for rash and skin lesion changes   Objective:   BP 105/68   Pulse 72   Ht 5\' 5"  (1.651 m)   Wt 254 lb 3.2 oz (115.3 kg)   BMI 42.30 kg/m  CONSTITUTIONAL: Well-developed, well-nourished female in no acute distress.  PSYCHIATRIC: Normal mood and affect. Normal behavior. Normal judgment and thought content. [name]: Alert and oriented to person, place, and time. Normal muscle tone coordination. No cranial nerve deficit noted. HENT:  Normocephalic, atraumatic, External right and left ear normal. Oropharynx is clear and moist EYES: Conjunctivae and EOM are normal. Pupils are equal, round, and reactive to light. No scleral icterus.  NECK: Normal range of motion, supple, no masses.  Normal thyroid.  SKIN: Skin is warm and dry. No rash noted. Not diaphoretic. No erythema. No  pallor. CARDIOVASCULAR: Normal heart rate noted, regular rhythm, no murmur. RESPIRATORY: Clear to auscultation bilaterally. Effort and breath sounds normal, no problems with respiration noted. BREASTS: Symmetric in size. No masses, skin changes, nipple drainage, or lymphadenopathy. ABDOMEN: Soft, normal bowel sounds, no distention noted.  No tenderness, rebound or guarding. Large pannus; no rash BLADDER: Normal PELVIC:  External Genitalia: Normal  BUS: Normal  Vagina: Normal  Cervix: Normal; no cervical motion tenderness  Uterus: Normal; not palpable due to body habitus  Adnexa: Normal; nonpalpable nontender  RV: External Exam NormaI, No Rectal Masses and Normal Sphincter tone  MUSCULOSKELETAL: Normal range of motion. No tenderness.  No cyanosis, clubbing, or edema.  2+ distal pulses. LYMPHATIC: No Axillary, Supraclavicular, or Inguinal Adenopathy.  Assessment:   Annual gynecologic examination 60 y.o. Contraception: ablation Obesity 1  Menopause Hot flashes, menopausal, desiring bioidentical HRT Plan:  Pap: due 2018 Mammogram: Ordered Stool Guaiac Testing: colonoscopy due 12/2016 Labs: thru pcp Routine preventative health maintenance measures emphasized: Exercise/Diet/Weight control, Tobacco Warnings and Alcohol/Substance use risks Continue calcium and vitamin D supplementation. Continue with healthy eating, exercise, and weight loss at 1 pound per month goal. Estradiol 1 mg a day; Prometrium 100 mg a day Return in 6 weeks for follow-up Return to [place], CMA  Brayton Mars, MD  Note: This dictation was prepared with Dragon dictation along with smaller phrase technology. Any transcriptional errors that result from this process are unintentional.

## 2016-09-22 ENCOUNTER — Ambulatory Visit (INDEPENDENT_AMBULATORY_CARE_PROVIDER_SITE_OTHER): Payer: Managed Care, Other (non HMO) | Admitting: Obstetrics and Gynecology

## 2016-09-22 VITALS — BP 105/68 | HR 72 | Ht 65.0 in | Wt 254.2 lb

## 2016-09-22 DIAGNOSIS — Z1231 Encounter for screening mammogram for malignant neoplasm of breast: Secondary | ICD-10-CM

## 2016-09-22 DIAGNOSIS — Z9889 Other specified postprocedural states: Secondary | ICD-10-CM | POA: Diagnosis not present

## 2016-09-22 DIAGNOSIS — N951 Menopausal and female climacteric states: Secondary | ICD-10-CM

## 2016-09-22 DIAGNOSIS — Z780 Asymptomatic menopausal state: Secondary | ICD-10-CM | POA: Diagnosis not present

## 2016-09-22 DIAGNOSIS — Z9851 Tubal ligation status: Secondary | ICD-10-CM | POA: Diagnosis not present

## 2016-09-22 DIAGNOSIS — Z1239 Encounter for other screening for malignant neoplasm of breast: Secondary | ICD-10-CM

## 2016-09-22 DIAGNOSIS — Z01419 Encounter for gynecological examination (general) (routine) without abnormal findings: Secondary | ICD-10-CM

## 2016-09-22 HISTORY — DX: Other specified postprocedural states: Z98.890

## 2016-09-22 HISTORY — DX: Tubal ligation status: Z98.51

## 2016-09-22 MED ORDER — ESTRADIOL 1 MG PO TABS: 1.0000 mg | ORAL_TABLET | Freq: Every day | ORAL | 6 refills | Status: DC

## 2016-09-22 MED ORDER — PROGESTERONE MICRONIZED 100 MG PO CAPS: 100.0000 mg | ORAL_CAPSULE | Freq: Every day | ORAL | 6 refills | Status: DC

## 2016-09-22 NOTE — Patient Instructions (Signed)
1. No Pap smear until 2018 2. Mammogram ordered 3. Colonoscopy is due in 2018 4. Begin trial of HRT in the form of estradiol 1 mg a day and Prometrium 100 mg a day 5. Return in 6 weeks for follow-up 6. Continue with healthy eating and exercise with control weight loss 7. Return in 1 year for annual exam  Menopause is a normal process in which your reproductive ability comes to an end. This process happens gradually over a span of months to years, usually between the ages of 86 and 79. Menopause is complete when you have missed 12 consecutive menstrual periods. It is important to talk with your health care provider about some of the most common conditions that affect postmenopausal women, such as heart disease, cancer, and bone loss (osteoporosis). Adopting a healthy lifestyle and getting preventive care can help to promote your health and wellness. Those actions can also lower your chances of developing some of these common conditions. WHAT SHOULD I KNOW ABOUT MENOPAUSE? During menopause, you may experience a number of symptoms, such as:  Moderate-to-severe hot flashes.  Night sweats.  Decrease in sex drive.  Mood swings.  Headaches.  Tiredness.  Irritability.  Memory problems.  Insomnia. Choosing to treat or not to treat menopausal changes is an individual decision that you make with your health care provider. WHAT SHOULD I KNOW ABOUT HORMONE REPLACEMENT THERAPY AND SUPPLEMENTS? Hormone therapy products are effective for treating symptoms that are associated with menopause, such as hot flashes and night sweats. Hormone replacement carries certain risks, especially as you become older. If you are thinking about using estrogen or estrogen with progestin treatments, discuss the benefits and risks with your health care provider. WHAT SHOULD I KNOW ABOUT HEART DISEASE AND STROKE? Heart disease, heart attack, and stroke become more likely as you age. This may be due, in part, to the  hormonal changes that your body experiences during menopause. These can affect how your body processes dietary fats, triglycerides, and cholesterol. Heart attack and stroke are both medical emergencies. There are many things that you can do to help prevent heart disease and stroke:  Have your blood pressure checked at least every 1-2 years. High blood pressure causes heart disease and increases the risk of stroke.  If you are 57-57 years old, ask your health care provider if you should take aspirin to prevent a heart attack or a stroke.  Do not use any tobacco products, including cigarettes, chewing tobacco, or electronic cigarettes. If you need help quitting, ask your health care provider.  It is important to eat a healthy diet and maintain a healthy weight.  Be sure to include plenty of vegetables, fruits, low-fat dairy products, and lean protein.  Avoid eating foods that are high in solid fats, added sugars, or salt (sodium).  Get regular exercise. This is one of the most important things that you can do for your health.  Try to exercise for at least 150 minutes each week. The type of exercise that you do should increase your heart rate and make you sweat. This is known as moderate-intensity exercise.  Try to do strengthening exercises at least twice each week. Do these in addition to the moderate-intensity exercise.  Know your numbers.Ask your health care provider to check your cholesterol and your blood glucose. Continue to have your blood tested as directed by your health care provider. WHAT SHOULD I KNOW ABOUT CANCER SCREENING? There are several types of cancer. Take the following steps to reduce  your risk and to catch any cancer development as early as possible. Breast Cancer  Practice breast self-awareness.  This means understanding how your breasts normally appear and feel.  It also means doing regular breast self-exams. Let your health care provider know about any changes,  no matter how small.  If you are 25 or older, have a clinician do a breast exam (clinical breast exam or CBE) every year. Depending on your age, family history, and medical history, it may be recommended that you also have a yearly breast X-ray (mammogram).  If you have a family history of breast cancer, talk with your health care provider about genetic screening.  If you are at high risk for breast cancer, talk with your health care provider about having an MRI and a mammogram every year.  Breast cancer (BRCA) gene test is recommended for women who have family members with BRCA-related cancers. Results of the assessment will determine the need for genetic counseling and BRCA1 and for BRCA2 testing. BRCA-related cancers include these types:  Breast. This occurs in males or females.  Ovarian.  Tubal. This may also be called fallopian tube cancer.  Cancer of the abdominal or pelvic lining (peritoneal cancer).  Prostate.  Pancreatic. Cervical, Uterine, and Ovarian Cancer Your health care provider may recommend that you be screened regularly for cancer of the pelvic organs. These include your ovaries, uterus, and vagina. This screening involves a pelvic exam, which includes checking for microscopic changes to the surface of your cervix (Pap test).  For women ages 21-65, health care providers may recommend a pelvic exam and a Pap test every three years. For women ages 45-65, they may recommend the Pap test and pelvic exam, combined with testing for human papilloma virus (HPV), every five years. Some types of HPV increase your risk of cervical cancer. Testing for HPV may also be done on women of any age who have unclear Pap test results.  Other health care providers may not recommend any screening for nonpregnant women who are considered low risk for pelvic cancer and have no symptoms. Ask your health care provider if a screening pelvic exam is right for you.  If you have had past treatment for  cervical cancer or a condition that could lead to cancer, you need Pap tests and screening for cancer for at least 20 years after your treatment. If Pap tests have been discontinued for you, your risk factors (such as having a new sexual partner) need to be reassessed to determine if you should start having screenings again. Some women have medical problems that increase the chance of getting cervical cancer. In these cases, your health care provider may recommend that you have screening and Pap tests more often.  If you have a family history of uterine cancer or ovarian cancer, talk with your health care provider about genetic screening.  If you have vaginal bleeding after reaching menopause, tell your health care provider.  There are currently no reliable tests available to screen for ovarian cancer. Lung Cancer Lung cancer screening is recommended for adults 22-74 years old who are at high risk for lung cancer because of a history of smoking. A yearly low-dose CT scan of the lungs is recommended if you:  Currently smoke.  Have a history of at least 30 pack-years of smoking and you currently smoke or have quit within the past 15 years. A pack-year is smoking an average of one pack of cigarettes per day for one year. Yearly screening should:  Continue until it has been 15 years since you quit.  Stop if you develop a health problem that would prevent you from having lung cancer treatment. Colorectal Cancer  This type of cancer can be detected and can often be prevented.  Routine colorectal cancer screening usually begins at age 62 and continues through age 35.  If you have risk factors for colon cancer, your health care provider may recommend that you be screened at an earlier age.  If you have a family history of colorectal cancer, talk with your health care provider about genetic screening.  Your health care provider may also recommend using home test kits to check for hidden blood in  your stool.  A small camera at the end of a tube can be used to examine your colon directly (sigmoidoscopy or colonoscopy). This is done to check for the earliest forms of colorectal cancer.  Direct examination of the colon should be repeated every 5-10 years until age 60. However, if early forms of precancerous polyps or small growths are found or if you have a family history or genetic risk for colorectal cancer, you may need to be screened more often. Skin Cancer  Check your skin from head to toe regularly.  Monitor any moles. Be sure to tell your health care provider:  About any new moles or changes in moles, especially if there is a change in a mole's shape or color.  If you have a mole that is larger than the size of a pencil eraser.  If any of your family members has a history of skin cancer, especially at a young age, talk with your health care provider about genetic screening.  Always use sunscreen. Apply sunscreen liberally and repeatedly throughout the day.  Whenever you are outside, protect yourself by wearing long sleeves, pants, a wide-brimmed hat, and sunglasses. WHAT SHOULD I KNOW ABOUT OSTEOPOROSIS? Osteoporosis is a condition in which bone destruction happens more quickly than new bone creation. After menopause, you may be at an increased risk for osteoporosis. To help prevent osteoporosis or the bone fractures that can happen because of osteoporosis, the following is recommended:  If you are 71-42 years old, get at least 1,000 mg of calcium and at least 600 mg of vitamin D per day.  If you are older than age 77 but younger than age 10, get at least 1,200 mg of calcium and at least 600 mg of vitamin D per day.  If you are older than age 64, get at least 1,200 mg of calcium and at least 800 mg of vitamin D per day. Smoking and excessive alcohol intake increase the risk of osteoporosis. Eat foods that are rich in calcium and vitamin D, and do weight-bearing exercises  several times each week as directed by your health care provider. WHAT SHOULD I KNOW ABOUT HOW MENOPAUSE AFFECTS [place]? Depression may occur at any age, but it is more common as you become older. Common symptoms of depression include:  Low or sad mood.  Changes in sleep patterns.  Changes in appetite or eating patterns.  Feeling an overall lack of motivation or enjoyment of activities that you previously enjoyed.  Frequent crying spells. Talk with your health care provider if you think that you are experiencing depression. WHAT SHOULD I KNOW ABOUT IMMUNIZATIONS? It is important that you get and maintain your immunizations. These include:  Tetanus, diphtheria, and pertussis (Tdap) booster vaccine.  Influenza every year before the flu season begins.  Pneumonia  vaccine.  Shingles vaccine. Your health care provider may also recommend other immunizations.   This information is not intended to replace advice given to you by your health care provider. Make sure you discuss any questions you have with your health care provider.   Document Released: 12/24/2005 Document Revised: 11/22/2014 Document Reviewed: 07/04/2014 Elsevier Interactive Patient Education [employer].

## 2016-09-22 NOTE — Addendum Note (Signed)
Addended by: Elouise Munroe on: 09/22/2016 02:58 PM   Modules accepted: Orders

## 2016-10-27 ENCOUNTER — Other Ambulatory Visit: Payer: Self-pay | Admitting: Obstetrics and Gynecology

## 2016-11-03 ENCOUNTER — Encounter: Payer: Self-pay | Admitting: Obstetrics and Gynecology

## 2016-11-03 ENCOUNTER — Ambulatory Visit (INDEPENDENT_AMBULATORY_CARE_PROVIDER_SITE_OTHER): Payer: Managed Care, Other (non HMO) | Admitting: Obstetrics and Gynecology

## 2016-11-03 VITALS — BP 121/74 | HR 69 | Ht 65.0 in | Wt 253.2 lb

## 2016-11-03 DIAGNOSIS — Z780 Asymptomatic menopausal state: Secondary | ICD-10-CM

## 2016-11-03 DIAGNOSIS — N951 Menopausal and female climacteric states: Secondary | ICD-10-CM

## 2016-11-03 DIAGNOSIS — Z9889 Other specified postprocedural states: Secondary | ICD-10-CM | POA: Diagnosis not present

## 2016-11-03 MED ORDER — PROGESTERONE MICRONIZED 100 MG PO CAPS: 100.0000 mg | ORAL_CAPSULE | Freq: Every day | ORAL | 2 refills | Status: DC

## 2016-11-03 MED ORDER — ESTRADIOL 1 MG PO TABS: 1.0000 mg | ORAL_TABLET | Freq: Every day | ORAL | 2 refills | Status: DC

## 2016-11-03 NOTE — Patient Instructions (Signed)
1. Continue Estratest 1.25/2.5 mg daily 2. Continue with Provera 5 mg daily 3. Return for annual exam as scheduled

## 2016-11-03 NOTE — Progress Notes (Signed)
Chief complaint: 1. Hormone replacement therapy follow-up  Gloria Wilkins is doing great on Estratest 1.25/2.5 mg daily and Provera 5 mg daily. Vasomotor symptoms are markedly reduced. She does have an occasional night sweat. She is pleased with the medical intervention. No side effects are noted. Patient is not sexually active at this time due to chronic recurrent yeast infections.  OBJECTIVE: BP 121/74   Pulse 69   Ht 5\' 5"  (1.651 m)   Wt 253 lb 4 oz (114.9 kg)   BMI 42.14 kg/m    ASSESSMENT: 1. Menopause, hot flashes and night sweats diminished with current HRT regimen  PLAN: 1. Continue Estratest 1.25/2.5 mg daily 2. Continue with Prempro 5 mg daily 3. Return for annual exam as scheduled  A total of 15 minutes were spent face-to-face with the patient during this encounter and over half of that time dealt with counseling and coordination of care.  Brayton Mars, MD  Note: This dictation was prepared with Dragon dictation along with smaller phrase technology. Any transcriptional errors that result from this process are unintentional.

## 2017-07-22 ENCOUNTER — Encounter: Payer: Self-pay | Admitting: *Deleted

## 2017-07-25 ENCOUNTER — Encounter: Payer: Self-pay | Admitting: *Deleted

## 2017-07-25 ENCOUNTER — Ambulatory Visit: Payer: Managed Care, Other (non HMO) | Admitting: Anesthesiology

## 2017-07-25 ENCOUNTER — Encounter
Admission: RE | Disposition: A | Discharge status: Home / Self Care | Payer: Self-pay | Source: Ambulatory Visit | Attending: Gastroenterology

## 2017-07-25 ENCOUNTER — Ambulatory Visit
Admission: RE | Admit: 2017-07-25 | Discharge: 2017-07-25 | Disposition: A | Discharge status: Home / Self Care | Payer: Managed Care, Other (non HMO) | Source: Ambulatory Visit | Attending: Gastroenterology | Admitting: Gastroenterology

## 2017-07-25 DIAGNOSIS — Z6841 Body Mass Index (BMI) 40.0 and over, adult: Secondary | ICD-10-CM | POA: Diagnosis not present

## 2017-07-25 DIAGNOSIS — Z1211 Encounter for screening for malignant neoplasm of colon: Secondary | ICD-10-CM | POA: Diagnosis present

## 2017-07-25 DIAGNOSIS — R638 Other symptoms and signs concerning food and fluid intake: Secondary | ICD-10-CM | POA: Diagnosis not present

## 2017-07-25 DIAGNOSIS — Z87891 Personal history of nicotine dependence: Secondary | ICD-10-CM | POA: Insufficient documentation

## 2017-07-25 DIAGNOSIS — K635 Polyp of colon: Secondary | ICD-10-CM | POA: Insufficient documentation

## 2017-07-25 DIAGNOSIS — Z79899 Other long term (current) drug therapy: Secondary | ICD-10-CM | POA: Diagnosis not present

## 2017-07-25 DIAGNOSIS — Z881 Allergy status to other antibiotic agents status: Secondary | ICD-10-CM | POA: Insufficient documentation

## 2017-07-25 DIAGNOSIS — Z7984 Long term (current) use of oral hypoglycemic drugs: Secondary | ICD-10-CM | POA: Insufficient documentation

## 2017-07-25 DIAGNOSIS — F419 Anxiety disorder, unspecified: Secondary | ICD-10-CM | POA: Insufficient documentation

## 2017-07-25 DIAGNOSIS — E669 Obesity, unspecified: Secondary | ICD-10-CM | POA: Diagnosis not present

## 2017-07-25 DIAGNOSIS — E119 Type 2 diabetes mellitus without complications: Secondary | ICD-10-CM | POA: Diagnosis not present

## 2017-07-25 DIAGNOSIS — K5730 Diverticulosis of large intestine without perforation or abscess without bleeding: Secondary | ICD-10-CM | POA: Diagnosis not present

## 2017-07-25 DIAGNOSIS — Z882 Allergy status to sulfonamides status: Secondary | ICD-10-CM | POA: Diagnosis not present

## 2017-07-25 DIAGNOSIS — K621 Rectal polyp: Secondary | ICD-10-CM | POA: Diagnosis not present

## 2017-07-25 DIAGNOSIS — E781 Pure hyperglyceridemia: Secondary | ICD-10-CM | POA: Diagnosis not present

## 2017-07-25 DIAGNOSIS — D124 Benign neoplasm of descending colon: Secondary | ICD-10-CM | POA: Diagnosis not present

## 2017-07-25 DIAGNOSIS — Z86010 Personal history of colonic polyps: Secondary | ICD-10-CM | POA: Diagnosis not present

## 2017-07-25 DIAGNOSIS — I10 Essential (primary) hypertension: Secondary | ICD-10-CM | POA: Insufficient documentation

## 2017-07-25 DIAGNOSIS — K219 Gastro-esophageal reflux disease without esophagitis: Secondary | ICD-10-CM | POA: Diagnosis not present

## 2017-07-25 HISTORY — DX: Bacteremia: R78.81

## 2017-07-25 HISTORY — DX: Pure hyperglyceridemia: E78.1

## 2017-07-25 HISTORY — DX: Obesity, unspecified: E66.9

## 2017-07-25 HISTORY — PX: COLONOSCOPY: SHX5424

## 2017-07-25 HISTORY — DX: Other specified disorders of bladder: N32.89

## 2017-07-25 HISTORY — DX: Disease of esophagus, unspecified: K22.9

## 2017-07-25 HISTORY — DX: Dyskinesia of esophagus: K22.4

## 2017-07-25 HISTORY — DX: Benign neoplasm of colon, unspecified: D12.6

## 2017-07-25 HISTORY — DX: Polyp of stomach and duodenum: K31.7

## 2017-07-25 HISTORY — DX: Polyp of colon: K63.5

## 2017-07-25 HISTORY — DX: Other symptoms and signs concerning food and fluid intake: R63.8

## 2017-07-25 LAB — GLUCOSE, CAPILLARY: GLUCOSE-CAPILLARY: 113 mg/dL — AB (ref 65–99)

## 2017-07-25 SURGERY — COLONOSCOPY
Anesthesia: General

## 2017-07-25 MED ORDER — SODIUM CHLORIDE 0.9 % IV SOLN: INTRAVENOUS | Status: DC

## 2017-07-25 MED ORDER — LIDOCAINE HCL (PF) 2 % IJ SOLN
INTRAMUSCULAR | Status: AC
  Filled 2017-07-25: qty 2

## 2017-07-25 MED ORDER — MIDAZOLAM HCL 2 MG/2ML IJ SOLN
INTRAMUSCULAR | Status: AC
  Filled 2017-07-25: qty 2

## 2017-07-25 MED ORDER — PHENYLEPHRINE HCL 10 MG/ML IJ SOLN
INTRAMUSCULAR | Status: AC
  Filled 2017-07-25: qty 1

## 2017-07-25 MED ORDER — LIDOCAINE HCL (CARDIAC) 20 MG/ML IV SOLN
INTRAVENOUS | Status: DC | PRN
  Administered 2017-07-25: 40 mg via INTRAVENOUS

## 2017-07-25 MED ORDER — GLYCOPYRROLATE 0.2 MG/ML IJ SOLN
INTRAMUSCULAR | Status: AC
  Filled 2017-07-25: qty 1

## 2017-07-25 MED ORDER — PROPOFOL 500 MG/50ML IV EMUL
INTRAVENOUS | Status: DC | PRN
  Administered 2017-07-25: 150 ug/kg/min via INTRAVENOUS

## 2017-07-25 MED ORDER — FENTANYL CITRATE (PF) 100 MCG/2ML IJ SOLN
INTRAMUSCULAR | Status: AC
  Filled 2017-07-25: qty 2

## 2017-07-25 MED ORDER — SODIUM CHLORIDE 0.9 % IV SOLN
INTRAVENOUS | Status: DC
  Administered 2017-07-25: 07:00:00 via INTRAVENOUS

## 2017-07-25 MED ORDER — EPHEDRINE SULFATE 50 MG/ML IJ SOLN
INTRAMUSCULAR | Status: AC
  Filled 2017-07-25: qty 1

## 2017-07-25 MED ORDER — PROPOFOL 500 MG/50ML IV EMUL
INTRAVENOUS | Status: AC
  Filled 2017-07-25: qty 50

## 2017-07-25 MED ORDER — MIDAZOLAM HCL 2 MG/2ML IJ SOLN
INTRAMUSCULAR | Status: DC | PRN
  Administered 2017-07-25: 2 mg via INTRAVENOUS

## 2017-07-25 MED ORDER — PROPOFOL 10 MG/ML IV BOLUS
INTRAVENOUS | Status: AC
  Filled 2017-07-25: qty 20

## 2017-07-25 MED ORDER — PROPOFOL 10 MG/ML IV BOLUS
INTRAVENOUS | Status: DC | PRN
Start: 2017-07-25 — End: 2017-07-25
  Administered 2017-07-25: 70 mg via INTRAVENOUS
  Administered 2017-07-25: 10 mg via INTRAVENOUS

## 2017-07-25 MED ORDER — FENTANYL CITRATE (PF) 100 MCG/2ML IJ SOLN
INTRAMUSCULAR | Status: DC | PRN
  Administered 2017-07-25: 25 ug via INTRAVENOUS

## 2017-07-25 NOTE — H&P (Signed)
Outpatient short stay form Pre-procedure 07/25/2017 7:35 AM Gloria Sails MD  Primary Physician: Dr Lamonte Sakai  Reason for visit:  Colonoscopy  History of present illness:  Patient is a 61 year old female presenting today as above. She has personal history of adenomatous colon polyps this procedure was done in February 2016. At that time she had multiple tubular adenomas were removed. She is presenting today for repeat check. She had difficulty with her prep this morning however states that she is clear today with no solid stool. She takes no aspirin or blood thinning agents.    Current Facility-Administered Medications:  .  0.9 %  sodium chloride infusion, , Intravenous, Continuous, Gloria Sails, MD, Last Rate: 20 mL/hr at 07/25/17 0725 .  0.9 %  sodium chloride infusion, , Intravenous, Continuous, Gloria Sails, MD  Prescriptions Prior to Admission  Medication Sig Dispense Refill Last Dose  . cholecalciferol (VITAMIN D) 1000 UNITS tablet Take 1,000 Units by mouth daily.   07/24/2017 at Unknown time  . diclofenac (VOLTAREN) 50 MG EC tablet    07/24/2017 at Unknown time  . estradiol (ESTRACE) 1 MG tablet Take 1 tablet (1 mg total) by mouth daily. 90 tablet 2 07/24/2017 at Unknown time  . felodipine (PLENDIL) 5 MG 24 hr tablet Take 5 mg by mouth daily.   07/24/2017 at Unknown time  . fenofibrate 160 MG tablet    Past Week at Unknown time  . losartan (COZAAR) 25 MG tablet Take by mouth.   Past Week at Unknown time  . metFORMIN (GLUCOPHAGE) 500 MG tablet Take by mouth.   Past Week at Unknown time  . nebivolol (BYSTOLIC) 5 MG tablet Take by mouth.   07/24/2017 at Unknown time  . pantoprazole (PROTONIX) 40 MG tablet    07/24/2017 at Unknown time  . Potassium 99 MG TABS Take by mouth.   07/24/2017 at Unknown time  . progesterone (PROMETRIUM) 100 MG capsule Take 1 capsule (100 mg total) by mouth daily. 90 capsule 2 07/24/2017 at Unknown time  . sertraline (ZOLOFT) 50 MG tablet TAKE 1 TABLET  DAILY 90 tablet 3 07/24/2017 at Unknown time  . canagliflozin (INVOKANA) 300 MG TABS tablet Take 300 mg by mouth daily before breakfast.   Taking  . Vitamin D, Ergocalciferol, (DRISDOL) 50000 units CAPS capsule Take 50,000 Units by mouth every 7 (seven) days.   Not Taking at Unknown time     Allergies  Allergen Reactions  . Vancomycin Hives  . Sulfa Antibiotics Rash     Past Medical History:  Diagnosis Date  . Abnormal uterine bleeding (AUB)   . Anxiety   . Bacteremia due to Gram-positive bacteria   . DM (diabetes mellitus) ([place])   . Esophageal motility disorder   . Gastric polyps   . GERD (gastroesophageal reflux disease)   . HTN (hypertension)   . Hyperplastic colonic polyp   . Hypertriglyceridemia   . Hypometabolism   . Increased BMI   . Irregular Z line of esophagus   . Obesity   . Perimenopausal   . Tubular adenoma of colon    x 8  . Unstable bladder 12/16/2014  . Vaginal Pap smear, abnormal     Review of systems:      Physical Exam    Heart and lungs: Regular rate and rhythm without rub or gallop, lungs are bilaterally clear.    HEENT: Normocephalic atraumatic eyes are anicteric    Other:     Pertinant exam for procedure: Soft  nontender nondistended bowel sounds positive normoactive.    Planned proceedures: Colonoscopy and indicated procedures. I have discussed the risks benefits and complications of procedures to include not limited to bleeding, infection, perforation and the risk of sedation and the patient wishes to proceed.    Gloria Sails, MD Gastroenterology 07/25/2017  7:35 AM

## 2017-07-25 NOTE — Op Note (Signed)
[facility] Patient Name: Gloria Wilkins Procedure Date: 07/25/2017 7:35 AM MRN: [id number] Account #: [id number] Date of Birth: 05-12-1956 Admit Type: Outpatient Age: 61 Room: [facility] ENDO ROOM 1 Gender: Female Note Status: Finalized Procedure:            Colonoscopy Indications:          Personal history of colonic polyps Providers:            Lollie Sails, MD Referring MD:         Perrin Maltese, MD (Referring MD) Medicines:            Monitored Anesthesia Care Complications:        No immediate complications. Procedure:            Pre-Anesthesia Assessment:                       - ASA Grade Assessment: III - A patient with severe                        systemic disease.                       After obtaining informed consent, the colonoscope was                        passed under direct vision. Throughout the procedure,                        the patient's blood pressure, pulse, and oxygen                        saturations were monitored continuously. The                        Colonoscope was introduced through the anus and                        advanced to the the cecum, identified by appendiceal                        orifice and ileocecal valve. The colonoscopy was                        performed with moderate difficulty due to significant                        looping. Successful completion of the procedure was                        aided by changing the patient to a supine position,                        changing the patient to a prone position and using                        manual pressure. The quality of the bowel preparation                        was good. Findings:      Two sessile polyps were found in the rectum. The polyps were  1 to 2 mm       in size. These polyps were removed with a cold biopsy forceps. Resection       and retrieval were complete.      A 1 mm polyp was found in the descending colon. The polyp was  sessile.       The polyp was removed with a cold biopsy forceps. Resection and       retrieval were complete.      A 2 mm polyp was found in the ascending colon. The polyp was sessile.       The polyp was removed with a cold biopsy forceps. Resection and       retrieval were complete.      A 3 mm polyp was found in the proximal descending colon. The polyp was       sessile. The polyp was removed with a cold biopsy forceps. Resection and       retrieval were complete.      A 3 mm polyp was found in the distal sigmoid colon. The polyp was       sessile. The polyp was removed with a cold biopsy forceps. Resection and       retrieval were complete.      Two sessile polyps were found in the rectum. The polyps were 1 to 2 mm       in size. These polyps were removed with a cold biopsy forceps, one from       near the anal verge. Resection and retrieval were complete.      Multiple small and large-mouthed diverticula were found in the sigmoid       colon, descending colon, transverse colon and ascending colon.      The retroflexed view of the distal rectum and anal verge was normal and       showed no anal or rectal abnormalities.      The digital rectal exam was normal. Impression:           - Two 1 to 2 mm polyps in the rectum, removed with a                        cold biopsy forceps. Resected and retrieved.                       - One 1 mm polyp in the descending colon, removed with                        a cold biopsy forceps. Resected and retrieved.                       - One 2 mm polyp in the ascending colon, removed with a                        cold biopsy forceps. Resected and retrieved.                       - One 3 mm polyp in the proximal descending colon,                        removed with a cold biopsy forceps. Resected and  retrieved.                       - One 3 mm polyp in the distal sigmoid colon, removed                        with a cold biopsy  forceps. Resected and retrieved.                       - Two 1 to 2 mm polyps in the rectum, removed with a                        cold biopsy forceps. Resected and retrieved.                       - Diverticulosis in the sigmoid colon, in the                        descending colon, in the transverse colon and in the                        ascending colon.                       - The distal rectum and anal verge are normal on                        retroflexion view. Recommendation:       - Await pathology results.                       - Telephone GI clinic for pathology results in 1 week. Procedure Code(s):    --- Professional ---                       [phone], Colonoscopy, flexible; with biopsy, single or                        multiple Diagnosis Code(s):    --- Professional ---                       K62.1, Rectal polyp                       D12.2, Benign neoplasm of ascending colon                       D12.4, Benign neoplasm of descending colon                       D12.5, Benign neoplasm of sigmoid colon                       Z86.010, Personal history of colonic polyps                       K57.30, Diverticulosis of large intestine without                        perforation or abscess without bleeding CPT copyright 2016 American Medical Association. All rights reserved. The codes documented in this report are preliminary and upon coder review  may  be revised to meet current compliance requirements. Lollie Sails, MD 07/25/2017 8:36:53 AM This report has been signed electronically. Number of Addenda: 0 Note Initiated On: 07/25/2017 7:35 AM Scope Withdrawal Time: 0 hours 11 minutes 9 seconds  Total Procedure Duration: 0 hours 40 minutes 11 seconds       [facility]

## 2017-07-25 NOTE — Anesthesia Post-op Follow-up Note (Signed)
Anesthesia QCDR form completed.        

## 2017-07-25 NOTE — Anesthesia Postprocedure Evaluation (Signed)
Anesthesia Post Note  Patient: Gloria Wilkins  Procedure(s) Performed: Procedure(s) (LRB): COLONOSCOPY (N/A)  Patient location during evaluation: Endoscopy Anesthesia Type: General Level of consciousness: awake and alert Pain management: pain level controlled Vital Signs Assessment: post-procedure vital signs reviewed and stable Respiratory status: spontaneous breathing, nonlabored ventilation, respiratory function stable and patient connected to nasal cannula oxygen Cardiovascular status: blood pressure returned to baseline and stable Postop Assessment: no signs of nausea or vomiting Anesthetic complications: no     Last Vitals:  Vitals:   07/25/17 0855 07/25/17 0905  BP: 121/71 124/68  Pulse: 60 64  Resp: 15 18  Temp:    SpO2: 97% 99%    Last Pain:  Vitals:   07/25/17 0835  TempSrc: Tympanic  PainSc: 0-No pain                 Precious Haws Caterine Mcmeans

## 2017-07-25 NOTE — Anesthesia Procedure Notes (Signed)
Date/Time: 07/25/2017 7:44 AM Performed by: Doreen Salvage Pre-anesthesia Checklist: Patient identified, Emergency Drugs available, Suction available and Patient being monitored Patient Re-evaluated:Patient Re-evaluated prior to induction Oxygen Delivery Method: Nasal cannula Induction Type: IV induction Dental Injury: Teeth and Oropharynx as per pre-operative assessment  Comments: Nasal cannula with etCO2 monitoring

## 2017-07-25 NOTE — Transfer of Care (Signed)
Immediate Anesthesia Transfer of Care Note  Patient: Gloria Wilkins  Procedure(s) Performed: Procedure(s): COLONOSCOPY (N/A)  Patient Location: PACU  Anesthesia Type:General  Level of Consciousness: sedated  Airway & Oxygen Therapy: Patient Spontanous Breathing and Patient connected to face mask oxygen  Post-op Assessment: Report given to RN and Post -op Vital signs reviewed and stable  Post vital signs: Reviewed and stable  Last Vitals:  Vitals:   07/25/17 0657 07/25/17 0835  BP: (!) 141/81 107/72  Pulse: 65 80  Resp: 16 20  Temp: 36.7 C (!) 36.1 C  SpO2: 16% 83%    Complications: No apparent anesthesia complications

## 2017-07-25 NOTE — Anesthesia Preprocedure Evaluation (Signed)
Anesthesia Evaluation  Patient identified by MRN, date of birth, ID band Patient awake    Reviewed: Allergy & Precautions, H&P , NPO status , Patient's Chart, lab work & pertinent test results  History of Anesthesia Complications Negative for: history of anesthetic complications  Airway Mallampati: III  TM Distance: <3 FB Neck ROM: limited    Dental  (+) Poor Dentition, Chipped, Missing, Partial Upper, Caps   Pulmonary neg shortness of breath, former smoker,           Cardiovascular Exercise Tolerance: Good hypertension, (-) angina(-) Past MI and (-) DOE      Neuro/Psych PSYCHIATRIC DISORDERS Anxiety negative neurological ROS     GI/Hepatic Neg liver ROS, GERD  Medicated and Controlled,  Endo/Other  diabetes, Type 2  Renal/GU negative Renal ROS  negative genitourinary   Musculoskeletal   Abdominal   Peds  Hematology negative hematology ROS (+)   Anesthesia Other Findings Past Medical History: No date: Abnormal uterine bleeding (AUB) No date: Anxiety No date: Bacteremia due to Gram-positive bacteria No date: DM (diabetes mellitus) (HCC) No date: Esophageal motility disorder No date: Gastric polyps No date: GERD (gastroesophageal reflux disease) No date: HTN (hypertension) No date: Hyperplastic colonic polyp No date: Hypertriglyceridemia No date: Hypometabolism No date: Increased BMI No date: Irregular Z line of esophagus No date: Obesity No date: Perimenopausal No date: Tubular adenoma of colon     Comment:  x 8 12/16/2014: Unstable bladder No date: Vaginal Pap smear, abnormal  Past Surgical History: No date: ablation     Comment:  endometrial No date: BACK SURGERY No date: CERVICAL DISCECTOMY No date: COLONOSCOPY No date: esophagitis No date: KNEE SURGERY; Left No date: LEEP No date: TUBAL LIGATION  BMI    Body Mass Index:  44.11 kg/m      Reproductive/Obstetrics negative OB ROS                              Anesthesia Physical Anesthesia Plan  ASA: III  Anesthesia Plan: General   Post-op Pain Management:    Induction: Intravenous  PONV Risk Score and Plan: Propofol infusion  Airway Management Planned: Natural Airway and Nasal Cannula  Additional Equipment:   Intra-op Plan:   Post-operative Plan:   Informed Consent: I have reviewed the patients History and Physical, chart, labs and discussed the procedure including the risks, benefits and alternatives for the proposed anesthesia with the patient or authorized representative who has indicated his/her understanding and acceptance.   Dental Advisory Given  Plan Discussed with: Anesthesiologist, CRNA and Surgeon  Anesthesia Plan Comments: (Patient consented for risks of anesthesia including but not limited to:  - adverse reactions to medications - risk of intubation if required - damage to teeth, lips or other oral mucosa - sore throat or hoarseness - Damage to heart, brain, lungs or loss of life  Patient voiced understanding.)        Anesthesia Quick Evaluation

## 2017-07-26 ENCOUNTER — Encounter: Payer: Self-pay | Admitting: Gastroenterology

## 2017-07-27 LAB — SURGICAL PATHOLOGY

## 2017-07-30 ENCOUNTER — Other Ambulatory Visit: Payer: Self-pay | Admitting: Obstetrics and Gynecology

## 2017-08-03 DIAGNOSIS — M159 Polyosteoarthritis, unspecified: Secondary | ICD-10-CM | POA: Insufficient documentation

## 2017-08-03 DIAGNOSIS — M2550 Pain in unspecified joint: Secondary | ICD-10-CM | POA: Insufficient documentation

## 2017-08-03 DIAGNOSIS — G8929 Other chronic pain: Secondary | ICD-10-CM | POA: Insufficient documentation

## 2017-08-04 ENCOUNTER — Ambulatory Visit (INDEPENDENT_AMBULATORY_CARE_PROVIDER_SITE_OTHER): Payer: Managed Care, Other (non HMO) | Admitting: Podiatry

## 2017-08-04 ENCOUNTER — Encounter: Payer: Self-pay | Admitting: Podiatry

## 2017-08-04 DIAGNOSIS — M2041 Other hammer toe(s) (acquired), right foot: Secondary | ICD-10-CM

## 2017-08-04 NOTE — Progress Notes (Signed)
   Subjective:    Patient ID: Gloria Wilkins, female    DOB: Mar 30, 1956, 61 y.o.   MRN: [id number]  HPI this patient presents to the office with chief complaint of a painful corn between the fourth and fifth toes of her right foot.  She says this corn has been increasing in pain over the last few weeks.  This patient says that she is diabetic and she is concerned about her feet.  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  She has provided no self treatment nor sought any professional help.  She also says her big toenails are starting to become deformed, but no pain at present.  She presents the office today for an evaluation and treatment of her diabetic feet    Review of Systems  All other systems reviewed and are negative.      Objective:   Physical Exam GENERAL APPEARANCE: Alert, conversant. Appropriately groomed. No acute distress.  VASCULAR: Pedal pulses are  palpable at  [facility] and PT bilateral.  Capillary refill time is immediate to all digits,  Normal temperature gradient.  Digital hair growth is present bilateral  NEUROLOGIC: sensation is normal to 5.07 monofilament at 5/5 sites bilateral.  Light touch is intact bilateral, Muscle strength normal.  MUSCULOSKELETAL: acceptable muscle strength, tone and stability bilateral.  Intrinsic muscluature intact bilateral.  Rectus appearance of feet.  Hammer toes 4,5 right  DERMATOLOGIC: skin color, texture, and turgor are within normal limits.  No preulcerative lesions or ulcers  are seen, no interdigital maceration noted.  No open lesions present.  . No drainage noted.  Corn 4/5 right foot. Pincer toenails hallux  B/L         Assessment & Plan:  Hammer toes  4,5  Right   Corn 4/5 right  IE  Debride corn 4/5 right  Padding dispensed.  Discussed conservative treatment vs. Surgical correction.   Gardiner Barefoot DPM

## 2017-09-23 NOTE — Progress Notes (Signed)
Patient ID: Gloria Wilkins, female   DOB: Nov 22, 1955, 61 y.o.   MRN: [id number] [facility] PREVENTATIVE CARE GYN  ENCOUNTER NOTE  Subjective:       Gloria Wilkins is a 61 y.o. [phone] female here for a routine annual gynecologic exam.  Current complaints: 1.  None  Vasomotor symptoms are persistent and appear not to be affected by HRT.  Patient is willing to go on HRT holiday at this time to see if it changes her symptomatology.  Workup is ongoing for possible serotonin syndrome due to Gloria Wilkins use. The weight is suboptimally controlled as her diabetes, lack of exercise, and lack of control eating habits are impacting the bottom line.  Colonoscopy 2018-6 polyps; patient have repeat colonoscopy in 5 years Last Pap: 09/03/2014 neg/neg. Results were: normal Last mammogram: 2015. Results were: normal  Obstetric History OB History  Gravida Para Term Preterm AB Living  2 2 2     2   SAB TAB Ectopic Multiple Live Births          2    # Outcome Date GA Lbr Len/2nd Weight Sex Delivery Anes PTL Lv  2 Term 1980     CS-LTranv   LIV  1 Term 1979     Vag-Spont   LIV      Past Medical History:  Diagnosis Date  . Abnormal uterine bleeding (AUB)   . Anxiety   . Bacteremia due to Gram-positive bacteria   . DM (diabetes mellitus) ([place])   . Esophageal motility disorder   . Gastric polyps   . GERD (gastroesophageal reflux disease)   . HTN (hypertension)   . Hyperplastic colonic polyp   . Hypertriglyceridemia   . Hypometabolism   . Increased BMI   . Irregular Z line of esophagus   . Obesity   . Perimenopausal   . Tubular adenoma of colon    x 8  . Unstable bladder 12/16/2014  . Vaginal Pap smear, abnormal     Past Surgical History:  Procedure Laterality Date  . ablation     endometrial  . BACK SURGERY    . CERVICAL DISCECTOMY    . COLONOSCOPY    . esophagitis    . KNEE SURGERY Left   . LEEP    . TUBAL LIGATION      Current Outpatient Medications on File Prior to Visit   Medication Sig Dispense Refill  . canagliflozin (INVOKANA) 300 MG TABS tablet Take 300 mg by mouth daily before breakfast.    . cholecalciferol (VITAMIN D) 1000 UNITS tablet Take 1,000 Units by mouth daily.    . diclofenac (VOLTAREN) 50 MG EC tablet     . estradiol (ESTRACE) 1 MG tablet TAKE 1 TABLET DAILY 90 tablet 2  . felodipine (PLENDIL) 5 MG 24 hr tablet Take 5 mg by mouth daily.    . fenofibrate 160 MG tablet     . losartan (COZAAR) 25 MG tablet Take by mouth.    . metFORMIN (GLUCOPHAGE) 500 MG tablet Take by mouth.    . nebivolol (BYSTOLIC) 5 MG tablet Take by mouth.    . pantoprazole (PROTONIX) 40 MG tablet     . Potassium 99 MG TABS Take by mouth.    . progesterone (PROMETRIUM) 100 MG capsule TAKE 1 CAPSULE DAILY 90 capsule 2  . sertraline (Gloria Wilkins) 50 MG tablet TAKE 1 TABLET DAILY 90 tablet 3  . Vitamin D, Ergocalciferol, (DRISDOL) 50000 units CAPS capsule Take 50,000 Units by mouth every 7 (seven)  days.     No current facility-administered medications on file prior to visit.     Allergies  Allergen Reactions  . Vancomycin Hives  . Sulfa Antibiotics Rash    Social History   Socioeconomic History  . Marital status: Married    Spouse name: Not on file  . Number of children: Not on file  . Years of education: Not on file  . Highest education level: Not on file  Social Needs  . Financial resource strain: Not on file  . Food insecurity - worry: Not on file  . Food insecurity - inability: Not on file  . Transportation needs - medical: Not on file  . Transportation needs - non-medical: Not on file  Occupational History  . Not on file  Tobacco Use  . Smoking status: Former Smoker    Last attempt to quit: 11/15/2000    Years since quitting: 16.8  . Smokeless tobacco: Former Network engineer and Sexual Activity  . Alcohol use: No  . Drug use: No  . Sexual activity: Yes    Birth control/protection: Surgical  Other Topics Concern  . Not on file  Social History  Narrative  . Not on file    Family History  Problem Relation Age of Onset  . Heart disease Father   . Breast cancer Maternal Aunt   . Diabetes Neg Hx   . Colon cancer Neg Hx   . Ovarian cancer Neg Hx     The following portions of the patient's history were reviewed and updated as appropriate: allergies, current medications, past family history, past medical history, past social history, past surgical history and problem list.  Review of Systems Review of Systems  Constitutional:       Vasomotor is persist despite HRT  Eyes: Negative.   Respiratory: Negative.   Cardiovascular: Negative.   Gastrointestinal: Negative.        Colonoscopy in September 2018 showed 6 polyps; next colonoscopy is due in 5 years-[place]  Genitourinary: Negative.   Musculoskeletal: Negative.   Skin: Negative.   Neurological: Negative.   Endo/Heme/Allergies: Negative.   Psychiatric/Behavioral:       Multiple stressors at home including mother with dementia and partner with congestive heart failure      Objective:   BP 99/64   Pulse 76   Ht 5\' 3"  (1.6 m)   Wt 265 lb 9.6 oz (120.5 kg)   BMI 47.05 kg/m  CONSTITUTIONAL: Well-developed, well-nourished female in no acute distress.  PSYCHIATRIC: Normal mood and affect. Normal behavior. Normal judgment and thought content. [place]: Alert and oriented to person, place, and time. Normal muscle tone coordination. No cranial nerve deficit noted. HENT:  Normocephalic, atraumatic, External right and left ear normal. Oropharynx is clear and moist EYES: Conjunctivae and EOM are normal. No scleral icterus.  NECK: Normal range of motion, supple, no masses.  Normal thyroid.  SKIN: Skin is warm and dry. No rash noted. Not diaphoretic. No erythema. No pallor. CARDIOVASCULAR: Normal heart rate noted, regular rhythm, no murmur. RESPIRATORY: Clear to auscultation bilaterally. Effort and breath sounds normal, no problems with respiration noted. BREASTS: Symmetric in  size. No masses, skin changes, nipple drainage, or lymphadenopathy. ABDOMEN: Soft, normal bowel sounds, no distention noted.  No tenderness, rebound or guarding. Large pannus; no rash BLADDER: Normal PELVIC:  External Genitalia: Normal vulva; 1 cm x 1.5 cm warty growth noted on mons pubis, unchanged  BUS: Normal  Vagina: Normal; good estrogen effect; redundant vaginal sidewalls precluded optimal visualization  of the cervix  Cervix: Normal; no cervical motion tenderness  Uterus: Normal; not palpable due to body habitus; no tenderness is noted  Adnexa: Normal; nonpalpable nontender  RV: External Exam NormaI, No Rectal Masses and Normal Sphincter tone  MUSCULOSKELETAL: Normal range of motion. No tenderness.  No cyanosis, clubbing, or edema.  2+ distal pulses. LYMPHATIC: No Axillary, Supraclavicular, or Inguinal Adenopathy.    Assessment:   Annual gynecologic examination 61 y.o. Contraception: ablation Obesity 3  Menopause Hot flashes, menopausal, refractory to HRT, both transdermal and oral Life stressors  Plan:  Pap: pap/hpv Mammogram: Ordered Stool Guaiac-colonoscopy 2018 completed Labs: thru pcp Routine preventative health maintenance measures emphasized: Exercise/Diet/Weight control, Tobacco Warnings and Alcohol/Substance use risks Continue calcium and vitamin D supplementation. Continue with healthy eating, exercise, and weight loss at 1 pound per month goal. Estradiol 1 mg a day; Prometrium 100 mg a day-discontinued at this time Consider psychology counseling for life stressors Return to [place], CMA  Brayton Mars, MD   Note: This dictation was prepared with Dragon dictation along with smaller phrase technology. Any transcriptional errors that result from this process are unintentional.

## 2017-09-28 ENCOUNTER — Ambulatory Visit (INDEPENDENT_AMBULATORY_CARE_PROVIDER_SITE_OTHER): Payer: Managed Care, Other (non HMO) | Admitting: Obstetrics and Gynecology

## 2017-09-28 ENCOUNTER — Encounter: Payer: Self-pay | Admitting: Obstetrics and Gynecology

## 2017-09-28 VITALS — BP 99/64 | HR 76 | Ht 63.0 in | Wt 265.6 lb

## 2017-09-28 DIAGNOSIS — Z780 Asymptomatic menopausal state: Secondary | ICD-10-CM | POA: Diagnosis not present

## 2017-09-28 DIAGNOSIS — Z9889 Other specified postprocedural states: Secondary | ICD-10-CM

## 2017-09-28 DIAGNOSIS — Z01419 Encounter for gynecological examination (general) (routine) without abnormal findings: Secondary | ICD-10-CM | POA: Diagnosis not present

## 2017-09-28 DIAGNOSIS — Z9851 Tubal ligation status: Secondary | ICD-10-CM

## 2017-09-28 DIAGNOSIS — Z658 Other specified problems related to psychosocial circumstances: Secondary | ICD-10-CM | POA: Diagnosis not present

## 2017-09-28 DIAGNOSIS — Z1231 Encounter for screening mammogram for malignant neoplasm of breast: Secondary | ICD-10-CM | POA: Diagnosis not present

## 2017-09-28 DIAGNOSIS — Z1239 Encounter for other screening for malignant neoplasm of breast: Secondary | ICD-10-CM

## 2017-09-28 MED ORDER — PROGESTERONE MICRONIZED 100 MG PO CAPS: 100.0000 mg | ORAL_CAPSULE | Freq: Every day | ORAL | 3 refills | Status: DC

## 2017-09-28 MED ORDER — ESTRADIOL 1 MG PO TABS: 1.0000 mg | ORAL_TABLET | Freq: Every day | ORAL | 3 refills | Status: DC

## 2017-09-28 NOTE — Patient Instructions (Addendum)
1.  Pap smear is done 2.  Mammogram is ordered 3.  Colonoscopy has been completed in September 2018; next colonoscopy is due in 5 years 4.  Screening labs are to be obtained through primary care 5.  Continue with healthy eating and exercise and controlled weight loss 6.  Continue with calcium and vitamin D supplementation daily 7.  HRT is to be discontinued at this time as it does not appear to be helping hot flashes 8.  Return in 1 year   Health Maintenance for Postmenopausal Women Menopause is a normal process in which your reproductive ability comes to an end. This process happens gradually over a span of months to years, usually between the ages of 10 and 52. Menopause is complete when you have missed 12 consecutive menstrual periods. It is important to talk with your health care provider about some of the most common conditions that affect postmenopausal women, such as heart disease, cancer, and bone loss (osteoporosis). Adopting a healthy lifestyle and getting preventive care can help to promote your health and wellness. Those actions can also lower your chances of developing some of these common conditions. What should I know about menopause? During menopause, you may experience a number of symptoms, such as:  Moderate-to-severe hot flashes.  Night sweats.  Decrease in sex drive.  Mood swings.  Headaches.  Tiredness.  Irritability.  Memory problems.  Insomnia.  Choosing to treat or not to treat menopausal changes is an individual decision that you make with your health care provider. What should I know about hormone replacement therapy and supplements? Hormone therapy products are effective for treating symptoms that are associated with menopause, such as hot flashes and night sweats. Hormone replacement carries certain risks, especially as you become older. If you are thinking about using estrogen or estrogen with progestin treatments, discuss the benefits and risks with  your health care provider. What should I know about heart disease and stroke? Heart disease, heart attack, and stroke become more likely as you age. This may be due, in part, to the hormonal changes that your body experiences during menopause. These can affect how your body processes dietary fats, triglycerides, and cholesterol. Heart attack and stroke are both medical emergencies. There are many things that you can do to help prevent heart disease and stroke:  Have your blood pressure checked at least every 1-2 years. High blood pressure causes heart disease and increases the risk of stroke.  If you are 41-48 years old, ask your health care provider if you should take aspirin to prevent a heart attack or a stroke.  Do not use any tobacco products, including cigarettes, chewing tobacco, or electronic cigarettes. If you need help quitting, ask your health care provider.  It is important to eat a healthy diet and maintain a healthy weight. ? Be sure to include plenty of vegetables, fruits, low-fat dairy products, and lean protein. ? Avoid eating foods that are high in solid fats, added sugars, or salt (sodium).  Get regular exercise. This is one of the most important things that you can do for your health. ? Try to exercise for at least 150 minutes each week. The type of exercise that you do should increase your heart rate and make you sweat. This is known as moderate-intensity exercise. ? Try to do strengthening exercises at least twice each week. Do these in addition to the moderate-intensity exercise.  Know your numbers.Ask your health care provider to check your cholesterol and your blood glucose.  Continue to have your blood tested as directed by your health care provider.  What should I know about cancer screening? There are several types of cancer. Take the following steps to reduce your risk and to catch any cancer development as early as possible. Breast Cancer  Practice breast  self-awareness. ? This means understanding how your breasts normally appear and feel. ? It also means doing regular breast self-exams. Let your health care provider know about any changes, no matter how small.  If you are 33 or older, have a clinician do a breast exam (clinical breast exam or CBE) every year. Depending on your age, family history, and medical history, it may be recommended that you also have a yearly breast X-ray (mammogram).  If you have a family history of breast cancer, talk with your health care provider about genetic screening.  If you are at high risk for breast cancer, talk with your health care provider about having an MRI and a mammogram every year.  Breast cancer (BRCA) gene test is recommended for women who have family members with BRCA-related cancers. Results of the assessment will determine the need for genetic counseling and BRCA1 and for BRCA2 testing. BRCA-related cancers include these types: ? Breast. This occurs in males or females. ? Ovarian. ? Tubal. This may also be called fallopian tube cancer. ? Cancer of the abdominal or pelvic lining (peritoneal cancer). ? Prostate. ? Pancreatic.  Cervical, Uterine, and Ovarian Cancer Your health care provider may recommend that you be screened regularly for cancer of the pelvic organs. These include your ovaries, uterus, and vagina. This screening involves a pelvic exam, which includes checking for microscopic changes to the surface of your cervix (Pap test).  For women ages 21-65, health care providers may recommend a pelvic exam and a Pap test every three years. For women ages 49-65, they may recommend the Pap test and pelvic exam, combined with testing for human papilloma virus (HPV), every five years. Some types of HPV increase your risk of cervical cancer. Testing for HPV may also be done on women of any age who have unclear Pap test results.  Other health care providers may not recommend any screening for  nonpregnant women who are considered low risk for pelvic cancer and have no symptoms. Ask your health care provider if a screening pelvic exam is right for you.  If you have had past treatment for cervical cancer or a condition that could lead to cancer, you need Pap tests and screening for cancer for at least 20 years after your treatment. If Pap tests have been discontinued for you, your risk factors (such as having a new sexual partner) need to be reassessed to determine if you should start having screenings again. Some women have medical problems that increase the chance of getting cervical cancer. In these cases, your health care provider may recommend that you have screening and Pap tests more often.  If you have a family history of uterine cancer or ovarian cancer, talk with your health care provider about genetic screening.  If you have vaginal bleeding after reaching menopause, tell your health care provider.  There are currently no reliable tests available to screen for ovarian cancer.  Lung Cancer Lung cancer screening is recommended for adults 4-18 years old who are at high risk for lung cancer because of a history of smoking. A yearly low-dose CT scan of the lungs is recommended if you:  Currently smoke.  Have a history of at least  30 pack-years of smoking and you currently smoke or have quit within the past 15 years. A pack-year is smoking an average of one pack of cigarettes per day for one year.  Yearly screening should:  Continue until it has been 15 years since you quit.  Stop if you develop a health problem that would prevent you from having lung cancer treatment.  Colorectal Cancer  This type of cancer can be detected and can often be prevented.  Routine colorectal cancer screening usually begins at age 23 and continues through age 63.  If you have risk factors for colon cancer, your health care provider may recommend that you be screened at an earlier age.  If you  have a family history of colorectal cancer, talk with your health care provider about genetic screening.  Your health care provider may also recommend using home test kits to check for hidden blood in your stool.  A small camera at the end of a tube can be used to examine your colon directly (sigmoidoscopy or colonoscopy). This is done to check for the earliest forms of colorectal cancer.  Direct examination of the colon should be repeated every 5-10 years until age 59. However, if early forms of precancerous polyps or small growths are found or if you have a family history or genetic risk for colorectal cancer, you may need to be screened more often.  Skin Cancer  Check your skin from head to toe regularly.  Monitor any moles. Be sure to tell your health care provider: ? About any new moles or changes in moles, especially if there is a change in a mole's shape or color. ? If you have a mole that is larger than the size of a pencil eraser.  If any of your family members has a history of skin cancer, especially at a young age, talk with your health care provider about genetic screening.  Always use sunscreen. Apply sunscreen liberally and repeatedly throughout the day.  Whenever you are outside, protect yourself by wearing long sleeves, pants, a wide-brimmed hat, and sunglasses.  What should I know about osteoporosis? Osteoporosis is a condition in which bone destruction happens more quickly than new bone creation. After menopause, you may be at an increased risk for osteoporosis. To help prevent osteoporosis or the bone fractures that can happen because of osteoporosis, the following is recommended:  If you are 84-33 years old, get at least 1,000 mg of calcium and at least 600 mg of vitamin D per day.  If you are older than age 40 but younger than age 79, get at least 1,200 mg of calcium and at least 600 mg of vitamin D per day.  If you are older than age 87, get at least 1,200 mg of  calcium and at least 800 mg of vitamin D per day.  Smoking and excessive alcohol intake increase the risk of osteoporosis. Eat foods that are rich in calcium and vitamin D, and do weight-bearing exercises several times each week as directed by your health care provider. What should I know about how menopause affects my mental health? Depression may occur at any age, but it is more common as you become older. Common symptoms of depression include:  Low or sad mood.  Changes in sleep patterns.  Changes in appetite or eating patterns.  Feeling an overall lack of motivation or enjoyment of activities that you previously enjoyed.  Frequent crying spells.  Talk with your health care provider if you think  that you are experiencing depression. What should I know about immunizations? It is important that you get and maintain your immunizations. These include:  Tetanus, diphtheria, and pertussis (Tdap) booster vaccine.  Influenza every year before the flu season begins.  Pneumonia vaccine.  Shingles vaccine.  Your health care provider may also recommend other immunizations. This information is not intended to replace advice given to you by your health care provider. Make sure you discuss any questions you have with your health care provider. Document Released: 12/24/2005 Document Revised: 05/21/2016 Document Reviewed: 08/05/2015 Elsevier Interactive Patient Education  2018 [employer]  

## 2017-09-30 LAB — IGP, COBASHPV16/18
HPV 16: NEGATIVE
HPV 18: NEGATIVE
HPV other hr types: NEGATIVE
PAP SMEAR COMMENT: 0

## 2017-11-30 DIAGNOSIS — M1712 Unilateral primary osteoarthritis, left knee: Secondary | ICD-10-CM | POA: Insufficient documentation

## 2017-11-30 DIAGNOSIS — M778 Other enthesopathies, not elsewhere classified: Secondary | ICD-10-CM | POA: Insufficient documentation

## 2018-10-03 NOTE — Progress Notes (Signed)
Patient ID: Gloria Wilkins, female   DOB: 01-12-1956, 62 y.o.   MRN: [id number] [facility] PREVENTATIVE CARE GYN  ENCOUNTER NOTE  Subjective:       Gloria Wilkins is a 62 y.o. [id number] female here for a routine annual gynecologic exam.  Current complaints: 1.  None 2.  Husband having cardiac issues, currently hospitalized at [facility], to have a mitral valve clipping due to hypertrophic cardiomyopathy.  Son has similar genetic issues with heart disease. 3.  Family history of Alzheimer's; Baker Janus is currently taking care of her mom at age 92 with Alzheimer's.  No genetic testing has been done. 4.  Colonoscopy 2018-6 polyps; patient have repeat colonoscopy in 5 years; patient reports no significant bowel symptoms at this time.  She is not experiencing any blood per rectum.  Stool guaiac cards are deferred this year. Last Pap: 09/2017 ascus/neg; next Pap smear will be 2021 Last mammogram: 2015. Results were normal  Obstetric History OB History  Gravida Para Term Preterm AB Living  2 2 2     2   SAB TAB Ectopic Multiple Live Births          2    # Outcome Date GA Lbr Len/2nd Weight Sex Delivery Anes PTL Lv  2 Term 1980     CS-LTranv   LIV  1 Term 1979     Vag-Spont   LIV    Past Medical History:  Diagnosis Date  . Abnormal uterine bleeding (AUB)   . Anxiety   . Bacteremia due to Gram-positive bacteria   . DM (diabetes mellitus) ([employer])   . Esophageal motility disorder   . Gastric polyps   . GERD (gastroesophageal reflux disease)   . HTN (hypertension)   . Hyperplastic colonic polyp   . Hypertriglyceridemia   . Hypometabolism   . Increased BMI   . Irregular Z line of esophagus   . Obesity   . Perimenopausal   . Tubular adenoma of colon    x 8  . Unstable bladder 12/16/2014  . Vaginal Pap smear, abnormal     Past Surgical History:  Procedure Laterality Date  . ablation     endometrial  . BACK SURGERY    . CERVICAL DISCECTOMY    . COLONOSCOPY    . COLONOSCOPY N/A 07/25/2017    Procedure: COLONOSCOPY;  Surgeon: Lollie Sails, MD;  Location: [facility] ENDOSCOPY;  Service: Endoscopy;  Laterality: N/A;  . esophagitis    . KNEE SURGERY Left   . LEEP    . TUBAL LIGATION      Current Outpatient Medications on File Prior to Visit  Medication Sig Dispense Refill  . cholecalciferol (VITAMIN D) 1000 UNITS tablet Take 1,000 Units by mouth daily.    . diclofenac (VOLTAREN) 50 MG EC tablet     . estradiol (ESTRACE) 1 MG tablet Take 1 tablet (1 mg total) daily by mouth. 90 tablet 3  . felodipine (PLENDIL) 5 MG 24 hr tablet Take 5 mg by mouth daily.    . fenofibrate 160 MG tablet     . losartan (COZAAR) 25 MG tablet Take by mouth.    . metFORMIN (GLUCOPHAGE) 500 MG tablet Take by mouth.    . nebivolol (BYSTOLIC) 5 MG tablet Take by mouth.    . pantoprazole (PROTONIX) 40 MG tablet     . Potassium 99 MG TABS Take by mouth.    . progesterone (PROMETRIUM) 100 MG capsule Take 1 capsule (100 mg total) daily by mouth. [place]  capsule 3  . sertraline (ZOLOFT) 50 MG tablet TAKE 1 TABLET DAILY 90 tablet 3   No current facility-administered medications on file prior to visit.     Allergies  Allergen Reactions  . Vancomycin Hives  . Sulfa Antibiotics Rash    Social History   Socioeconomic History  . Marital status: Married    Spouse name: Not on file  . Number of children: Not on file  . Years of education: Not on file  . Highest education level: Not on file  Occupational History  . Not on file  Social Needs  . Financial resource strain: Not on file  . Food insecurity:    Worry: Not on file    Inability: Not on file  . Transportation needs:    Medical: Not on file    Non-medical: Not on file  Tobacco Use  . Smoking status: Former Smoker    Last attempt to quit: 11/15/2000    Years since quitting: 17.8  . Smokeless tobacco: Former Network engineer and Sexual Activity  . Alcohol use: No  . Drug use: No  . Sexual activity: Yes    Birth control/protection: Surgical   Lifestyle  . Physical activity:    Days per week: 0 days    Minutes per session: 0 min  . Stress: Not on file  Relationships  . Social connections:    Talks on phone: Not on file    Gets together: Not on file    Attends religious service: Not on file    Active member of club or organization: Not on file    Attends meetings of clubs or organizations: Not on file    Relationship status: Not on file  . Intimate partner violence:    Fear of current or ex partner: No    Emotionally abused: No    Physically abused: No    Forced sexual activity: No  Other Topics Concern  . Not on file  Social History Narrative  . Not on file    Family History  Problem Relation Age of Onset  . Heart disease Father   . Breast cancer Maternal Aunt   . Diabetes Neg Hx   . Colon cancer Neg Hx   . Ovarian cancer Neg Hx     The following portions of the patient's history were reviewed and updated as appropriate: allergies, current medications, past family history, past medical history, past social history, past surgical history and problem list.  Review of Systems Review of Systems  Constitutional:       Occasional vasomotor symptom  HENT: Negative.   Respiratory: Negative.   Cardiovascular: Negative.   Gastrointestinal: Positive for constipation. Negative for blood in stool and melena.  Genitourinary: Negative.   Musculoskeletal: Negative.   Neurological: Negative.   Psychiatric/Behavioral: The patient is nervous/anxious.       Objective:   BP 114/77   Ht 5\' 3"  (1.6 m)   Wt 208 lb 6.4 oz (94.5 kg)   BMI 36.92 kg/m  CONSTITUTIONAL: Well-developed, well-nourished female in no acute distress.  PSYCHIATRIC: Normal mood and affect. Normal behavior. Normal judgment and thought content. [place]: Alert and oriented to person, place, and time. Normal muscle tone coordination. No cranial nerve deficit noted. HENT:  Normocephalic, atraumatic, External right and left ear normal. EYES:  Conjunctivae and EOM are normal. No scleral icterus.  NECK: Normal range of motion, supple, no masses.  Normal thyroid.  SKIN: Skin is warm and dry. No rash noted. Not diaphoretic.  No erythema. No pallor. CARDIOVASCULAR: Normal heart rate noted, regular rhythm, no murmur. RESPIRATORY: Clear to auscultation bilaterally. Effort and breath sounds normal, no problems with respiration noted. BREASTS: Symmetric in size. No masses, skin changes, nipple drainage, or lymphadenopathy. ABDOMEN: Soft, normal bowel sounds, no distention noted.  No tenderness, rebound or guarding. Large pannus; no rash BLADDER: Normal PELVIC:  External Genitalia: Normal vulva; 1 cm x 1.5 cm warty growth noted on mons pubis, no change from last year  BUS: Normal  Vagina: Normal; good estrogen effect; redundant vaginal sidewalls precluded optimal visualization of the cervix  Cervix: Normal; no cervical motion tenderness; no discharge  uterus: Normal; not enlarged; no tenderness is noted  Adnexa: Normal; nonpalpable nontender  RV: External Exam NormaI, No Rectal Masses and Normal Sphincter tone  MUSCULOSKELETAL: Normal range of motion. No tenderness.  No cyanosis, clubbing, or edema.  2+ distal pulses. LYMPHATIC: No Axillary, Supraclavicular, or Inguinal Adenopathy.    Assessment:   Annual gynecologic examination 62 y.o. Contraception: ablation Obesity 3 6 pound weight loss this year Menopause Hot flashes, menopausal, refractory to HRT, both transdermal and oral Life stressors-mom with Alzheimer's (primary caregiver); husband at [facility] with hypertrophic cardiomyopathy and congestive heart failure currently; son with current similar heart disease, not as advanced Cholesterol panel outstanding Hemoglobin A1c decreased from 8.5-5.8 TSH normal  Plan:  Pap: pap/hpv not done.  Next is due 2021 Mammogram: Ordered Stool Guaiac-colonoscopy 2018 completed Labs: thru pcp Routine preventative health maintenance measures  emphasized: Exercise/Diet/Weight control, Tobacco Warnings and Alcohol/Substance use risks Continue calcium and vitamin D supplementation. Continue with healthy eating, exercise, and weight loss at 1 pound per month goal. No HRT desired No anxiolytic therapy at this time Consider psychology counseling for life stressors Return to [place], CMA  Brayton Mars, MD    Shaune Pascal CMA acting as scribe for Dr. Enzo Bi. I have reviewed, updated, and concur with the information scribed.   Note: This dictation was prepared with Dragon dictation along with smaller phrase technology. Any transcriptional errors that result from this process are unintentional.

## 2018-10-04 ENCOUNTER — Encounter: Payer: Self-pay | Admitting: Obstetrics and Gynecology

## 2018-10-04 ENCOUNTER — Ambulatory Visit (INDEPENDENT_AMBULATORY_CARE_PROVIDER_SITE_OTHER): Payer: BLUE CROSS/BLUE SHIELD | Admitting: Obstetrics and Gynecology

## 2018-10-04 VITALS — BP 114/77 | Ht 63.0 in | Wt 208.4 lb

## 2018-10-04 DIAGNOSIS — Z780 Asymptomatic menopausal state: Secondary | ICD-10-CM

## 2018-10-04 DIAGNOSIS — Z1239 Encounter for other screening for malignant neoplasm of breast: Secondary | ICD-10-CM | POA: Diagnosis not present

## 2018-10-04 DIAGNOSIS — Z9889 Other specified postprocedural states: Secondary | ICD-10-CM | POA: Diagnosis not present

## 2018-10-04 DIAGNOSIS — Z658 Other specified problems related to psychosocial circumstances: Secondary | ICD-10-CM

## 2018-10-04 DIAGNOSIS — Z9851 Tubal ligation status: Secondary | ICD-10-CM

## 2018-10-04 DIAGNOSIS — Z01419 Encounter for gynecological examination (general) (routine) without abnormal findings: Secondary | ICD-10-CM | POA: Diagnosis not present

## 2018-10-04 DIAGNOSIS — N951 Menopausal and female climacteric states: Secondary | ICD-10-CM

## 2018-10-04 NOTE — Patient Instructions (Addendum)
1.  Pap smear is not done.  Next Pap smear is due 2021. 2.  Mammogram is ordered. 3.  Screening colonoscopy was completed in 2018, to be repeated in 5 years; patient is not experiencing any bowel symptoms, guaiac cards are deferred this year. 4.  Continue with screening labs through primary care 5.  Recommend and encourage calcium with vitamin D supplementation-consider Tums 6.  Continue with healthy eating, increased exercise as tolerated, and continued weight loss.  60 pound weight loss this past year is outstanding! 7.  No HRT therapy. 8.  No antianxiety therapy. 9.  Consider psychology counseling for life stressors if desired 10.  Return in 1 year for annual exam with Dr. Amalia Hailey   Health Maintenance for Postmenopausal Women Menopause is a normal process in which your reproductive ability comes to an end. This process happens gradually over a span of months to years, usually between the ages of 35 and 52. Menopause is complete when you have missed 12 consecutive menstrual periods. It is important to talk with your health care provider about some of the most common conditions that affect postmenopausal women, such as heart disease, cancer, and bone loss (osteoporosis). Adopting a healthy lifestyle and getting preventive care can help to promote your health and wellness. Those actions can also lower your chances of developing some of these common conditions. What should I know about menopause? During menopause, you may experience a number of symptoms, such as:  Moderate-to-severe hot flashes.  Night sweats.  Decrease in sex drive.  Mood swings.  Headaches.  Tiredness.  Irritability.  Memory problems.  Insomnia.  Choosing to treat or not to treat menopausal changes is an individual decision that you make with your health care provider. What should I know about hormone replacement therapy and supplements? Hormone therapy products are effective for treating symptoms that are  associated with menopause, such as hot flashes and night sweats. Hormone replacement carries certain risks, especially as you become older. If you are thinking about using estrogen or estrogen with progestin treatments, discuss the benefits and risks with your health care provider. What should I know about heart disease and stroke? Heart disease, heart attack, and stroke become more likely as you age. This may be due, in part, to the hormonal changes that your body experiences during menopause. These can affect how your body processes dietary fats, triglycerides, and cholesterol. Heart attack and stroke are both medical emergencies. There are many things that you can do to help prevent heart disease and stroke:  Have your blood pressure checked at least every 1-2 years. High blood pressure causes heart disease and increases the risk of stroke.  If you are 60-69 years old, ask your health care provider if you should take aspirin to prevent a heart attack or a stroke.  Do not use any tobacco products, including cigarettes, chewing tobacco, or electronic cigarettes. If you need help quitting, ask your health care provider.  It is important to eat a healthy diet and maintain a healthy weight. ? Be sure to include plenty of vegetables, fruits, low-fat dairy products, and lean protein. ? Avoid eating foods that are high in solid fats, added sugars, or salt (sodium).  Get regular exercise. This is one of the most important things that you can do for your health. ? Try to exercise for at least 150 minutes each week. The type of exercise that you do should increase your heart rate and make you sweat. This is known as moderate-intensity  exercise. ? Try to do strengthening exercises at least twice each week. Do these in addition to the moderate-intensity exercise.  Know your numbers.Ask your health care provider to check your cholesterol and your blood glucose. Continue to have your blood tested as directed  by your health care provider.  What should I know about cancer screening? There are several types of cancer. Take the following steps to reduce your risk and to catch any cancer development as early as possible. Breast Cancer  Practice breast self-awareness. ? This means understanding how your breasts normally appear and feel. ? It also means doing regular breast self-exams. Let your health care provider know about any changes, no matter how small.  If you are 70 or older, have a clinician do a breast exam (clinical breast exam or CBE) every year. Depending on your age, family history, and medical history, it may be recommended that you also have a yearly breast X-ray (mammogram).  If you have a family history of breast cancer, talk with your health care provider about genetic screening.  If you are at high risk for breast cancer, talk with your health care provider about having an MRI and a mammogram every year.  Breast cancer (BRCA) gene test is recommended for women who have family members with BRCA-related cancers. Results of the assessment will determine the need for genetic counseling and BRCA1 and for BRCA2 testing. BRCA-related cancers include these types: ? Breast. This occurs in males or females. ? Ovarian. ? Tubal. This may also be called fallopian tube cancer. ? Cancer of the abdominal or pelvic lining (peritoneal cancer). ? Prostate. ? Pancreatic.  Cervical, Uterine, and Ovarian Cancer Your health care provider may recommend that you be screened regularly for cancer of the pelvic organs. These include your ovaries, uterus, and vagina. This screening involves a pelvic exam, which includes checking for microscopic changes to the surface of your cervix (Pap test).  For women ages 21-65, health care providers may recommend a pelvic exam and a Pap test every three years. For women ages 70-65, they may recommend the Pap test and pelvic exam, combined with testing for human papilloma  virus (HPV), every five years. Some types of HPV increase your risk of cervical cancer. Testing for HPV may also be done on women of any age who have unclear Pap test results.  Other health care providers may not recommend any screening for nonpregnant women who are considered low risk for pelvic cancer and have no symptoms. Ask your health care provider if a screening pelvic exam is right for you.  If you have had past treatment for cervical cancer or a condition that could lead to cancer, you need Pap tests and screening for cancer for at least 20 years after your treatment. If Pap tests have been discontinued for you, your risk factors (such as having a new sexual partner) need to be reassessed to determine if you should start having screenings again. Some women have medical problems that increase the chance of getting cervical cancer. In these cases, your health care provider may recommend that you have screening and Pap tests more often.  If you have a family history of uterine cancer or ovarian cancer, talk with your health care provider about genetic screening.  If you have vaginal bleeding after reaching menopause, tell your health care provider.  There are currently no reliable tests available to screen for ovarian cancer.  Lung Cancer Lung cancer screening is recommended for adults 100-30 years old  who are at high risk for lung cancer because of a history of smoking. A yearly low-dose CT scan of the lungs is recommended if you:  Currently smoke.  Have a history of at least 30 pack-years of smoking and you currently smoke or have quit within the past 15 years. A pack-year is smoking an average of one pack of cigarettes per day for one year.  Yearly screening should:  Continue until it has been 15 years since you quit.  Stop if you develop a health problem that would prevent you from having lung cancer treatment.  Colorectal Cancer  This type of cancer can be detected and can often  be prevented.  Routine colorectal cancer screening usually begins at age 8 and continues through age 33.  If you have risk factors for colon cancer, your health care provider may recommend that you be screened at an earlier age.  If you have a family history of colorectal cancer, talk with your health care provider about genetic screening.  Your health care provider may also recommend using home test kits to check for hidden blood in your stool.  A small camera at the end of a tube can be used to examine your colon directly (sigmoidoscopy or colonoscopy). This is done to check for the earliest forms of colorectal cancer.  Direct examination of the colon should be repeated every 5-10 years until age 37. However, if early forms of precancerous polyps or small growths are found or if you have a family history or genetic risk for colorectal cancer, you may need to be screened more often.  Skin Cancer  Check your skin from head to toe regularly.  Monitor any moles. Be sure to tell your health care provider: ? About any new moles or changes in moles, especially if there is a change in a mole's shape or color. ? If you have a mole that is larger than the size of a pencil eraser.  If any of your family members has a history of skin cancer, especially at a young age, talk with your health care provider about genetic screening.  Always use sunscreen. Apply sunscreen liberally and repeatedly throughout the day.  Whenever you are outside, protect yourself by wearing long sleeves, pants, a wide-brimmed hat, and sunglasses.  What should I know about osteoporosis? Osteoporosis is a condition in which bone destruction happens more quickly than new bone creation. After menopause, you may be at an increased risk for osteoporosis. To help prevent osteoporosis or the bone fractures that can happen because of osteoporosis, the following is recommended:  If you are 52-6 years old, get at least 1,000 mg of  calcium and at least 600 mg of vitamin D per day.  If you are older than age 68 but younger than age 41, get at least 1,200 mg of calcium and at least 600 mg of vitamin D per day.  If you are older than age 23, get at least 1,200 mg of calcium and at least 800 mg of vitamin D per day.  Smoking and excessive alcohol intake increase the risk of osteoporosis. Eat foods that are rich in calcium and vitamin D, and do weight-bearing exercises several times each week as directed by your health care provider. What should I know about how menopause affects my mental health? Depression may occur at any age, but it is more common as you become older. Common symptoms of depression include:  Low or sad mood.  Changes in sleep patterns.  Changes in appetite or eating patterns.  Feeling an overall lack of motivation or enjoyment of activities that you previously enjoyed.  Frequent crying spells.  Talk with your health care provider if you think that you are experiencing depression. What should I know about immunizations? It is important that you get and maintain your immunizations. These include:  Tetanus, diphtheria, and pertussis (Tdap) booster vaccine.  Influenza every year before the flu season begins.  Pneumonia vaccine.  Shingles vaccine.  Your health care provider may also recommend other immunizations. This information is not intended to replace advice given to you by your health care provider. Make sure you discuss any questions you have with your health care provider. Document Released: 12/24/2005 Document Revised: 05/21/2016 Document Reviewed: 08/05/2015 Elsevier Interactive Patient Education  2018 [employer].

## 2019-01-03 ENCOUNTER — Other Ambulatory Visit: Payer: Self-pay | Admitting: Nurse Practitioner

## 2019-01-03 DIAGNOSIS — Z1231 Encounter for screening mammogram for malignant neoplasm of breast: Secondary | ICD-10-CM

## 2019-01-16 ENCOUNTER — Ambulatory Visit
Admission: RE | Admit: 2019-01-16 | Discharge: 2019-01-16 | Disposition: A | Discharge status: Home / Self Care | Payer: BLUE CROSS/BLUE SHIELD | Source: Ambulatory Visit | Attending: Nurse Practitioner | Admitting: Nurse Practitioner

## 2019-01-16 DIAGNOSIS — Z1231 Encounter for screening mammogram for malignant neoplasm of breast: Secondary | ICD-10-CM | POA: Insufficient documentation

## 2019-01-17 ENCOUNTER — Other Ambulatory Visit: Payer: Self-pay | Admitting: Nurse Practitioner

## 2019-01-17 DIAGNOSIS — N6320 Unspecified lump in the left breast, unspecified quadrant: Secondary | ICD-10-CM

## 2019-01-17 DIAGNOSIS — R928 Other abnormal and inconclusive findings on diagnostic imaging of breast: Secondary | ICD-10-CM

## 2019-01-24 ENCOUNTER — Other Ambulatory Visit: Payer: Self-pay

## 2019-01-24 ENCOUNTER — Ambulatory Visit
Admission: RE | Admit: 2019-01-24 | Discharge: 2019-01-24 | Disposition: A | Discharge status: Home / Self Care | Payer: BLUE CROSS/BLUE SHIELD | Source: Ambulatory Visit | Attending: Nurse Practitioner | Admitting: Nurse Practitioner

## 2019-01-24 DIAGNOSIS — R928 Other abnormal and inconclusive findings on diagnostic imaging of breast: Secondary | ICD-10-CM | POA: Diagnosis present

## 2019-01-24 DIAGNOSIS — N6320 Unspecified lump in the left breast, unspecified quadrant: Secondary | ICD-10-CM | POA: Insufficient documentation

## 2019-10-10 ENCOUNTER — Ambulatory Visit (INDEPENDENT_AMBULATORY_CARE_PROVIDER_SITE_OTHER): Payer: BC Managed Care – PPO | Admitting: Obstetrics and Gynecology

## 2019-10-10 ENCOUNTER — Encounter: Payer: Self-pay | Admitting: Obstetrics and Gynecology

## 2019-10-10 ENCOUNTER — Other Ambulatory Visit: Payer: Self-pay

## 2019-10-10 VITALS — BP 115/78 | HR 65 | Ht 65.0 in | Wt 221.8 lb

## 2019-10-10 DIAGNOSIS — Z01419 Encounter for gynecological examination (general) (routine) without abnormal findings: Secondary | ICD-10-CM | POA: Diagnosis not present

## 2019-10-10 DIAGNOSIS — E669 Obesity, unspecified: Secondary | ICD-10-CM | POA: Diagnosis not present

## 2019-10-10 NOTE — Progress Notes (Signed)
Patient comes in today for yearly exam. No concerns today. Pap 2018 was abnormal.

## 2019-10-10 NOTE — Progress Notes (Signed)
HPI:      [name] is a 63 y.o. [phone] who LMP was No LMP recorded. Patient has had an ablation.  Subjective:   She presents today for her annual examination.  She has no complaints.  She is no longer taking HRT for menopausal symptoms.  She states she rarely has hot flashes or night sweats anymore.  She gets her blood work performed through her PCP. She is not due for a colonoscopy for at least 2 more years.    Hx: The following portions of the patient's history were reviewed and updated as appropriate:             She  has a past medical history of Abnormal uterine bleeding (AUB), Anxiety, Bacteremia due to Gram-positive bacteria, DM (diabetes mellitus) ([place]), Esophageal motility disorder, Gastric polyps, GERD (gastroesophageal reflux disease), HTN (hypertension), Hyperplastic colonic polyp, Hypertriglyceridemia, Hypometabolism, Increased BMI, Irregular Z line of esophagus, Obesity, Perimenopausal, Tubular adenoma of colon, Unstable bladder (12/16/2014), and Vaginal Pap smear, abnormal. She does not have any pertinent problems on file. She  has a past surgical history that includes ablation; Cervical discectomy; LEEP; Tubal ligation; Knee surgery (Left); Back surgery; Colonoscopy; esophagitis; and Colonoscopy (N/A, 07/25/2017). Her family history includes Breast cancer in her daughter, maternal aunt, and maternal aunt; Heart disease in her father. She  reports that she quit smoking about 18 years ago. She has quit using smokeless tobacco. She reports that she does not drink alcohol or use drugs. She has a current medication list which includes the following prescription(s): cholecalciferol, diclofenac, empagliflozin, felodipine, losartan, metformin, nebivolol, pantoprazole, potassium, and ozempic (0.25 or 0.5 mg/dose). She is allergic to vancomycin and sulfa antibiotics.       Review of Systems:  Review of Systems  Constitutional: Denied constitutional symptoms, night sweats,  recent illness, fatigue, fever, insomnia and weight loss.  Eyes: Denied eye symptoms, eye pain, photophobia, vision change and visual disturbance.  Ears/Nose/Throat/Neck: Denied ear, nose, throat or neck symptoms, hearing loss, nasal discharge, sinus congestion and sore throat.  Cardiovascular: Denied cardiovascular symptoms, arrhythmia, chest pain/pressure, edema, exercise intolerance, orthopnea and palpitations.  Respiratory: Denied pulmonary symptoms, asthma, pleuritic pain, productive sputum, cough, dyspnea and wheezing.  Gastrointestinal: Denied, gastro-esophageal reflux, melena, nausea and vomiting.  Genitourinary: Denied genitourinary symptoms including symptomatic vaginal discharge, pelvic relaxation issues, and urinary complaints.  Musculoskeletal: Denied musculoskeletal symptoms, stiffness, swelling, muscle weakness and myalgia.  Dermatologic: Denied dermatology symptoms, rash and scar.  Neurologic: Denied neurology symptoms, dizziness, headache, neck pain and syncope.  Psychiatric: Denied psychiatric symptoms, anxiety and depression.  Endocrine: Denied endocrine symptoms including hot flashes and night sweats.   Meds:   Current Outpatient Medications on File Prior to Visit  Medication Sig Dispense Refill  . cholecalciferol (VITAMIN D) 1000 UNITS tablet Take 1,000 Units by mouth daily.    . diclofenac (VOLTAREN) 50 MG EC tablet     . empagliflozin (JARDIANCE) 25 MG TABS tablet     . felodipine (PLENDIL) 5 MG 24 hr tablet Take 5 mg by mouth daily.    Marland Kitchen losartan (COZAAR) 25 MG tablet Take by mouth.    . metFORMIN (GLUCOPHAGE) 500 MG tablet Take by mouth.    . nebivolol (BYSTOLIC) 5 MG tablet Take by mouth.    . pantoprazole (PROTONIX) 40 MG tablet     . Potassium 99 MG TABS Take by mouth.    . Semaglutide,0.25 or 0.5MG /DOS, (OZEMPIC, 0.25 OR 0.5 MG/DOSE,) 2 MG/1.5ML SOPN  No current facility-administered medications on file prior to visit.     Objective:     Vitals:    10/10/19 1435  BP: 115/78  Pulse: 65              Physical examination General NAD, Conversant  HEENT Atraumatic; Op clear with mmm.  Normo-cephalic. Pupils reactive. Anicteric sclerae  Thyroid/Neck Smooth without nodularity or enlargement. Normal ROM.  Neck Supple.  Skin No rashes, lesions or ulceration. Normal palpated skin turgor. No nodularity.  Breasts: No masses or discharge.  Symmetric.  No axillary adenopathy.  Lungs: Clear to auscultation.No rales or wheezes. Normal Respiratory effort, no retractions.  Heart: NSR.  No murmurs or rubs appreciated. No periferal edema  Abdomen: Soft.  Non-tender.  No masses.  No HSM. No hernia  Extremities: Moves all appropriately.  Normal ROM for age. No lymphadenopathy.  Neuro: Oriented to PPT.  Normal mood. Normal affect.     Pelvic:   Vulva: Normal appearance.  No lesions.  Vagina: No lesions or abnormalities noted.  Support:  Second-degree cystocele second-degree rectocele  Urethra No masses tenderness or scarring.  Meatus Normal size without lesions or prolapse.  Cervix: Normal appearance.  No lesions.  Anus: Normal exam.  No lesions.  Perineum: Normal exam.  No lesions.        Bimanual   Uterus: Normal size.  Non-tender.  Mobile.  AV.  Adnexae: No masses.  Non-tender to palpation.  Cul-de-sac: Negative for abnormality.      Assessment:    [id number] Patient Active Problem List   Diagnosis Date Noted  . Psychosocial stressors 09/28/2017  . Status post tubal ligation 09/22/2016  . Status post endometrial ablation 09/22/2016  . Sepsis ([place]) 08/13/2016  . Menopause 05/28/2016  . Hot flashes, menopausal 05/28/2016  . Obesity, Class III, BMI 40-49.9 (morbid obesity) ([place]) 05/28/2016  . Anxiety state 12/03/2014  . Acid reflux 12/03/2014  . Essential (primary) hypertension 12/03/2014  . Hypometabolism 12/03/2014  . Pure hyperglyceridemia 12/03/2014     1. Well woman exam with routine gynecological exam   2. Obesity (BMI  35.0-39.9 without comorbidity)     Patient without complaints at this time-no significant findings on exam.   Plan:            1.  Basic Screening Recommendations The basic screening recommendations for asymptomatic women were discussed with the patient during her visit.  The age-appropriate recommendations were discussed with her and the rational for the tests reviewed.  When I am informed by the patient that another primary care physician has previously obtained the age-appropriate tests and they are up-to-date, only outstanding tests are ordered and referrals given as necessary.  Abnormal results of tests will be discussed with her when all of her results are completed.  Routine preventative health maintenance measures emphasized: Exercise/Diet/Weight control, Tobacco Warnings, Alcohol/Substance use risks and Stress Management Pap next year. Orders No orders of the defined types were placed in this encounter.   No orders of the defined types were placed in this encounter.       F/U  Return in about 1 year (around 10/09/2020) for Annual Physical.  Finis Bud, M.D. 10/10/2019 3:11 PM

## 2020-02-12 ENCOUNTER — Ambulatory Visit: Payer: BC Managed Care – PPO | Admitting: Podiatry

## 2020-02-12 ENCOUNTER — Other Ambulatory Visit: Payer: Self-pay

## 2020-02-12 ENCOUNTER — Encounter: Payer: Self-pay | Admitting: Podiatry

## 2020-02-12 DIAGNOSIS — M778 Other enthesopathies, not elsewhere classified: Secondary | ICD-10-CM | POA: Diagnosis not present

## 2020-02-12 DIAGNOSIS — Q828 Other specified congenital malformations of skin: Secondary | ICD-10-CM | POA: Diagnosis not present

## 2020-02-12 NOTE — Progress Notes (Signed)
Subjective:  Patient ID: Gloria Wilkins, female    DOB: May 09, 1956,  MRN: [id number]  Chief Complaint  Patient presents with  . Plantar Warts    pt is here for a possible plantar wart of the left foot submet 5th toe, pt states it has been going on for 3-4 months, pt states it is painful to apply pressure. Pt has tried otc creams, but not much has helped.    64 y.o. female presents with the above complaint.  Patient presents with complaints of pain along the left fifth metatarsal phalangeal joint.  Patient states been going for past 3 to 4 months.  Has progressively gotten worse especially when applying pressure to the left side.  It appears that there is a callus formation/porokeratosis is causing her a lot of pain when ambulating on.  She has tried debridement but has not helped.  She has tried over-the-counter wart removal however this not wart and therefore not being able to dissolve.  She has tried all the over-the-counter therapy.  She would like to know if there is any other surgical interventions that could be done to help resolve this.  She denies any other acute complaints.   Review of Systems: Negative except as noted in the HPI. Denies N/V/F/Ch.  Past Medical History:  Diagnosis Date  . Abnormal uterine bleeding (AUB)   . Anxiety   . Bacteremia due to Gram-positive bacteria   . DM (diabetes mellitus) ([place])   . Esophageal motility disorder   . Gastric polyps   . GERD (gastroesophageal reflux disease)   . HTN (hypertension)   . Hyperplastic colonic polyp   . Hypertriglyceridemia   . Hypometabolism   . Increased BMI   . Irregular Z line of esophagus   . Obesity   . Perimenopausal   . Tubular adenoma of colon    x 8  . Unstable bladder 12/16/2014  . Vaginal Pap smear, abnormal     Current Outpatient Medications:  .  cholecalciferol (VITAMIN D) 1000 UNITS tablet, Take 1,000 Units by mouth daily., Disp: , Rfl:  .  diclofenac (VOLTAREN) 50 MG EC tablet, , Disp: , Rfl:    .  empagliflozin (JARDIANCE) 25 MG TABS tablet, , Disp: , Rfl:  .  felodipine (PLENDIL) 5 MG 24 hr tablet, Take 5 mg by mouth daily., Disp: , Rfl:  .  losartan (COZAAR) 25 MG tablet, Take by mouth., Disp: , Rfl:  .  metFORMIN (GLUCOPHAGE) 500 MG tablet, Take by mouth., Disp: , Rfl:  .  nebivolol (BYSTOLIC) 5 MG tablet, Take by mouth., Disp: , Rfl:  .  pantoprazole (PROTONIX) 40 MG tablet, , Disp: , Rfl:  .  Potassium 99 MG TABS, Take by mouth., Disp: , Rfl:  .  Semaglutide,0.25 or 0.5MG /DOS, (OZEMPIC, 0.25 OR 0.5 MG/DOSE,) 2 MG/1.5ML SOPN, , Disp: , Rfl:   Social History   Tobacco Use  Smoking Status Former Smoker  . Quit date: 11/15/2000  . Years since quitting: 19.2  Smokeless Tobacco Former User    Allergies  Allergen Reactions  . Vancomycin Hives  . Sulfa Antibiotics Rash  . Sulfasalazine Rash   Objective:  There were no vitals filed for this visit. There is no height or weight on file to calculate BMI. Constitutional Well developed. Well nourished.  Vascular Dorsalis pedis pulses palpable bilaterally. Posterior tibial pulses palpable bilaterally. Capillary refill normal to all digits.  No cyanosis or clubbing noted. Pedal hair growth normal.  Neurologic Normal speech. Oriented to person,  place, and time. Epicritic sensation to light touch grossly present bilaterally.  Dermatologic Nails well groomed and normal in appearance. No open wounds. No skin lesions.  Orthopedic:  Mild pain with range of motion of the fifth digit at the metatarsophalangeal joint on the left side.  No intra-articular pain.  Pain on palpation to the hyperkeratotic lesion with a nucleated core on the plantar aspect/submetatarsal 5.  No pinpoint bleeding noted.   Radiographs: None Assessment:   1. Porokeratosis   2. Capsulitis of left foot    Plan:  Patient was evaluated and treated and all questions answered.  Left fifth metatarsophalangeal joint capsulitis with underlying submetatarsal 5  porokeratosis -I explained to the patient the etiology of capsulitis with its relationship to the porokeratosis and the painful nature that is attributed to it.  Given the amount of inflammatory component associated from constant weightbearing to the lesion site I believe patient will benefit from a steroid injection to help decrease some of the pain associated with it. -I also believe patient will benefit from excessive debridement with excision of the nucleated core.  Patient states understanding would like to proceed with injection followed by excision of the nucleated core. -Offloading pad was also dispensed and if helpful to offload the pain associated we will consider doing custom-made orthotics with offloading pad.  No follow-ups on file.

## 2020-03-11 ENCOUNTER — Ambulatory Visit (INDEPENDENT_AMBULATORY_CARE_PROVIDER_SITE_OTHER): Payer: BC Managed Care – PPO | Admitting: Podiatry

## 2020-03-11 ENCOUNTER — Encounter: Payer: Self-pay | Admitting: Podiatry

## 2020-03-11 ENCOUNTER — Other Ambulatory Visit: Payer: Self-pay

## 2020-03-11 DIAGNOSIS — L989 Disorder of the skin and subcutaneous tissue, unspecified: Secondary | ICD-10-CM

## 2020-03-11 DIAGNOSIS — Q828 Other specified congenital malformations of skin: Secondary | ICD-10-CM | POA: Diagnosis not present

## 2020-03-12 ENCOUNTER — Encounter: Payer: Self-pay | Admitting: Podiatry

## 2020-03-12 NOTE — Progress Notes (Signed)
Subjective:  Patient ID: Gloria Wilkins, female    DOB: Jan 12, 1956,  MRN: [id number]  Chief Complaint  Patient presents with  . Foot Pain    "the injection lasted several days then the pain came back"  . Callouses    64 y.o. female presents with the above complaint.  Patient presents with a follow-up of left fifth digit porokeratosis.  Patient states the injection did not help it gave her temporary relief.  She states the pain came back again and it seems to be about the same.  She would like to discuss if there is any other management.  The debridement did help a little bit and she would like to do that today.  However she would like to discuss offloading management.  She would also consider orthotics if they are eventually helpful.  She denies any other acute complaints.  She also states that the offloading pad did help a little bit take the pressure off of it and therefore relieve some of the pain.   Review of Systems: Negative except as noted in the HPI. Denies N/V/F/Ch.  Past Medical History:  Diagnosis Date  . Abnormal uterine bleeding (AUB)   . Anxiety   . Bacteremia due to Gram-positive bacteria   . DM (diabetes mellitus) ([place])   . Esophageal motility disorder   . Gastric polyps   . GERD (gastroesophageal reflux disease)   . HTN (hypertension)   . Hyperplastic colonic polyp   . Hypertriglyceridemia   . Hypometabolism   . Increased BMI   . Irregular Z line of esophagus   . Obesity   . Perimenopausal   . Tubular adenoma of colon    x 8  . Unstable bladder 12/16/2014  . Vaginal Pap smear, abnormal     Current Outpatient Medications:  .  cholecalciferol (VITAMIN D) 1000 UNITS tablet, Take 1,000 Units by mouth daily., Disp: , Rfl:  .  diclofenac (VOLTAREN) 50 MG EC tablet, , Disp: , Rfl:  .  empagliflozin (JARDIANCE) 25 MG TABS tablet, , Disp: , Rfl:  .  felodipine (PLENDIL) 5 MG 24 hr tablet, Take 5 mg by mouth daily., Disp: , Rfl:  .  losartan (COZAAR) 25 MG  tablet, Take by mouth., Disp: , Rfl:  .  metFORMIN (GLUCOPHAGE) 500 MG tablet, Take by mouth., Disp: , Rfl:  .  nebivolol (BYSTOLIC) 5 MG tablet, Take by mouth., Disp: , Rfl:  .  pantoprazole (PROTONIX) 40 MG tablet, , Disp: , Rfl:  .  Potassium 99 MG TABS, Take by mouth., Disp: , Rfl:  .  Semaglutide,0.25 or 0.5MG /DOS, (OZEMPIC, 0.25 OR 0.5 MG/DOSE,) 2 MG/1.5ML SOPN, , Disp: , Rfl:   Social History   Tobacco Use  Smoking Status Former Smoker  . Quit date: 11/15/2000  . Years since quitting: 19.3  Smokeless Tobacco Former User    Allergies  Allergen Reactions  . Vancomycin Hives  . Sulfa Antibiotics Rash  . Sulfasalazine Rash   Objective:  There were no vitals filed for this visit. There is no height or weight on file to calculate BMI. Constitutional Well developed. Well nourished.  Vascular Dorsalis pedis pulses palpable bilaterally. Posterior tibial pulses palpable bilaterally. Capillary refill normal to all digits.  No cyanosis or clubbing noted. Pedal hair growth normal.  Neurologic Normal speech. Oriented to person, place, and time. Epicritic sensation to light touch grossly present bilaterally.  Dermatologic Nails well groomed and normal in appearance. No open wounds. No skin lesions.  Orthopedic:  Mild  pain with range of motion of the fifth digit at the metatarsophalangeal joint on the left side.  No intra-articular pain.  Pain on palpation to the hyperkeratotic lesion with a nucleated core on the plantar aspect/submetatarsal 5.  No pinpoint bleeding noted.   Radiographs: None Assessment:   1. Benign skin lesion   2. Porokeratosis    Plan:  Patient was evaluated and treated and all questions answered.  Left fifth metatarsophalangeal joint capsulitis with underlying submetatarsal 5 porokeratosis -It appears that clinically patient did not get any improvement from a steroid injection followed by do aggressive debridement.  Therefore I will hold off on another  steroid injection for now. -I discussed with her the management with orthotics with offloading built right underneath the submetatarsal 5.  Patient would like to continue with debridement for now and she will think about orthotics if there is no resolve meant in the near future.  I also believe that she will benefit from chemical destruction of porokeratosis with Cantharone therapy.  I explained to the patient the application process and she would like to proceed with Cantharone therapy for now. -I will discuss orthotics management during next visit if offloading pads are successful.  No follow-ups on file.

## 2020-05-13 ENCOUNTER — Ambulatory Visit: Payer: BC Managed Care – PPO | Admitting: Podiatry

## 2020-05-13 ENCOUNTER — Other Ambulatory Visit: Payer: Self-pay

## 2020-05-13 ENCOUNTER — Encounter: Payer: Self-pay | Admitting: Podiatry

## 2020-05-13 DIAGNOSIS — Q828 Other specified congenital malformations of skin: Secondary | ICD-10-CM | POA: Diagnosis not present

## 2020-05-13 DIAGNOSIS — L989 Disorder of the skin and subcutaneous tissue, unspecified: Secondary | ICD-10-CM | POA: Diagnosis not present

## 2020-05-13 NOTE — Progress Notes (Signed)
Subjective:  Patient ID: Gloria Wilkins, female    DOB: [birth year],  MRN: [id number]  Chief Complaint  Patient presents with   Foot Pain    pt is here for a f/u of left foot capsulitis, pt states that she is feeling better since last time, but states that the pain has recently came back.     64 y.o. female presents with the above complaint.  P patient presents with a follow-up of left fifth digit porokeratosis.  Patient states the lesion has completely healed.  The Cantharone therapy worked Recruitment consultant.  Patient has been offloading with an offloading pad which has helped a lot.  She does not have any pain.  Her pain scale sometimes gets 4 out of 10.  She denies any other acute complaints.   Review of Systems: Negative except as noted in the HPI. Denies N/V/F/Ch.  Past Medical History:  Diagnosis Date   Abnormal uterine bleeding (AUB)    Anxiety    Bacteremia due to Gram-positive bacteria    DM (diabetes mellitus) ([facility])    Esophageal motility disorder    Gastric polyps    GERD (gastroesophageal reflux disease)    HTN (hypertension)    Hyperplastic colonic polyp    Hypertriglyceridemia    Hypometabolism    Increased BMI    Irregular Z line of esophagus    Obesity    Perimenopausal    Tubular adenoma of colon    x 8   Unstable bladder 12/16/2014   Vaginal Pap smear, abnormal     Current Outpatient Medications:    cholecalciferol (VITAMIN D) 1000 UNITS tablet, Take 1,000 Units by mouth daily., Disp: , Rfl:    diclofenac (VOLTAREN) 50 MG EC tablet, , Disp: , Rfl:    felodipine (PLENDIL) 5 MG 24 hr tablet, Take 5 mg by mouth daily., Disp: , Rfl:    losartan (COZAAR) 25 MG tablet, Take by mouth., Disp: , Rfl:    metFORMIN (GLUCOPHAGE) 500 MG tablet, Take by mouth., Disp: , Rfl:    nebivolol (BYSTOLIC) 5 MG tablet, Take by mouth., Disp: , Rfl:    pantoprazole (PROTONIX) 40 MG tablet, , Disp: , Rfl:    Potassium 99 MG TABS, Take by mouth., Disp: ,  Rfl:    Semaglutide,0.25 or 0.5MG /DOS, (OZEMPIC, 0.25 OR 0.5 MG/DOSE,) 2 MG/1.5ML SOPN, , Disp: , Rfl:    empagliflozin (JARDIANCE) 25 MG TABS tablet, , Disp: , Rfl:   Social History   Tobacco Use  Smoking Status Former Smoker   Quit date: 11/15/2000   Years since quitting: 19.5  Smokeless Tobacco Former User    Allergies  Allergen Reactions   Vancomycin Hives   Sulfa Antibiotics Rash   Sulfasalazine Rash   Objective:  There were no vitals filed for this visit. There is no height or weight on file to calculate BMI. Constitutional Well developed. Well nourished.  Vascular Dorsalis pedis pulses palpable bilaterally. Posterior tibial pulses palpable bilaterally. Capillary refill normal to all digits.  No cyanosis or clubbing noted. Pedal hair growth normal.  Neurologic Normal speech. Oriented to person, place, and time. Epicritic sensation to light touch grossly present bilaterally.  Dermatologic Nails well groomed and normal in appearance. No open wounds. No skin lesions.  Orthopedic:  Mild pain with range of motion of the fifth digit at the metatarsophalangeal joint on the left side.  No intra-articular pain.  No pain on palpation to the hyperkeratotic lesion with a nucleated core on the plantar aspect/submetatarsal 5.  No pinpoint bleeding noted.   Radiographs: None Assessment:   1. Benign skin lesion   2. Porokeratosis    Plan:  Patient was evaluated and treated and all questions answered.  Left fifth metatarsophalangeal joint capsulitis with underlying submetatarsal 5 porokeratosis -Clinically patient's lesion has completely resolved along with her pain.  She is doing really well and has been able to ambulate in regular sneakers with offloading pad. -I discussed with her orthotics management with incorporation of the fifth digit offloading pad.  She will continue to think about it if this pain reoccurs in the future we will plan on getting orthotics. -No  further Cantharone therapy as the lesion has completely resolved.  No follow-ups on file.

## 2020-06-27 ENCOUNTER — Emergency Department: Payer: BC Managed Care – PPO

## 2020-06-27 ENCOUNTER — Encounter: Payer: Self-pay | Admitting: Emergency Medicine

## 2020-06-27 ENCOUNTER — Other Ambulatory Visit: Payer: Self-pay

## 2020-06-27 ENCOUNTER — Emergency Department
Admission: EM | Admit: 2020-06-27 | Discharge: 2020-06-27 | Disposition: A | Discharge status: Home / Self Care | Payer: BC Managed Care – PPO | Attending: Student in an Organized Health Care Education/Training Program | Admitting: Student in an Organized Health Care Education/Training Program

## 2020-06-27 DIAGNOSIS — R109 Unspecified abdominal pain: Secondary | ICD-10-CM | POA: Diagnosis present

## 2020-06-27 DIAGNOSIS — E119 Type 2 diabetes mellitus without complications: Secondary | ICD-10-CM | POA: Insufficient documentation

## 2020-06-27 DIAGNOSIS — I10 Essential (primary) hypertension: Secondary | ICD-10-CM | POA: Insufficient documentation

## 2020-06-27 DIAGNOSIS — K8020 Calculus of gallbladder without cholecystitis without obstruction: Secondary | ICD-10-CM | POA: Diagnosis not present

## 2020-06-27 DIAGNOSIS — R1011 Right upper quadrant pain: Secondary | ICD-10-CM | POA: Insufficient documentation

## 2020-06-27 DIAGNOSIS — Z87891 Personal history of nicotine dependence: Secondary | ICD-10-CM | POA: Diagnosis not present

## 2020-06-27 DIAGNOSIS — K219 Gastro-esophageal reflux disease without esophagitis: Secondary | ICD-10-CM | POA: Insufficient documentation

## 2020-06-27 LAB — CBC WITH DIFFERENTIAL/PLATELET
Abs Immature Granulocytes: 0.03 10*3/uL (ref 0.00–0.07)
Basophils Absolute: 0.1 10*3/uL (ref 0.0–0.1)
Basophils Relative: 1 %
Eosinophils Absolute: 0.3 10*3/uL (ref 0.0–0.5)
Eosinophils Relative: 3 %
HCT: 44.9 % (ref 36.0–46.0)
Hemoglobin: 14.9 g/dL (ref 12.0–15.0)
Immature Granulocytes: 0 %
Lymphocytes Relative: 35 %
Lymphs Abs: 2.7 10*3/uL (ref 0.7–4.0)
MCH: 29.4 pg (ref 26.0–34.0)
MCHC: 33.2 g/dL (ref 30.0–36.0)
MCV: 88.6 fL (ref 80.0–100.0)
Monocytes Absolute: 0.7 10*3/uL (ref 0.1–1.0)
Monocytes Relative: 9 %
Neutro Abs: 4.1 10*3/uL (ref 1.7–7.7)
Neutrophils Relative %: 52 %
Platelets: 276 10*3/uL (ref 150–400)
RBC: 5.07 MIL/uL (ref 3.87–5.11)
RDW: 13.6 % (ref 11.5–15.5)
WBC: 7.8 10*3/uL (ref 4.0–10.5)
nRBC: 0 % (ref 0.0–0.2)

## 2020-06-27 LAB — COMPREHENSIVE METABOLIC PANEL
ALT: 36 U/L (ref 0–44)
AST: 41 U/L (ref 15–41)
Albumin: 4.3 g/dL (ref 3.5–5.0)
Alkaline Phosphatase: 75 U/L (ref 38–126)
Anion gap: 12 (ref 5–15)
BUN: 20 mg/dL (ref 8–23)
CO2: 25 mmol/L (ref 22–32)
Calcium: 9.2 mg/dL (ref 8.9–10.3)
Chloride: 104 mmol/L (ref 98–111)
Creatinine, Ser: 0.97 mg/dL (ref 0.44–1.00)
GFR calc Af Amer: >60 mL/min (ref >60)
GFR calc non Af Amer: >60 mL/min (ref >60)
Glucose, Bld: 132 mg/dL — ABNORMAL HIGH (ref 70–99)
Potassium: 4.4 mmol/L (ref 3.5–5.1)
Sodium: 141 mmol/L (ref 135–145)
Total Bilirubin: 0.8 mg/dL (ref 0.3–1.2)
Total Protein: 7.5 g/dL (ref 6.5–8.1)

## 2020-06-27 LAB — LIPASE, BLOOD: Lipase: 43 U/L (ref 11–51)

## 2020-06-27 LAB — TROPONIN I (HIGH SENSITIVITY)
Troponin I (High Sensitivity): <2 ng/L (ref <18)
Troponin I (High Sensitivity): 2 ng/L (ref <18)

## 2020-06-27 MED ORDER — LIDOCAINE VISCOUS HCL 2 % MT SOLN: 15.0000 mL | Freq: Once | OROMUCOSAL | Status: DC

## 2020-06-27 MED ORDER — ONDANSETRON HCL 4 MG/2ML IJ SOLN
4.0000 mg | Freq: Once | INTRAMUSCULAR | Status: AC
  Administered 2020-06-27: 4 mg via INTRAVENOUS
  Filled 2020-06-27: qty 2

## 2020-06-27 MED ORDER — MORPHINE SULFATE (PF) 4 MG/ML IV SOLN: 4.0000 mg | INTRAVENOUS | Status: DC | PRN

## 2020-06-27 MED ORDER — ONDANSETRON 4 MG PO TBDP: 4.0000 mg | ORAL_TABLET | Freq: Once | ORAL | Status: DC

## 2020-06-27 MED ORDER — ALUM & MAG HYDROXIDE-SIMETH 200-200-20 MG/5ML PO SUSP: 30.0000 mL | Freq: Once | ORAL | Status: DC

## 2020-06-27 MED ORDER — SODIUM CHLORIDE 0.9 % IV BOLUS
500.0000 mL | Freq: Once | INTRAVENOUS | Status: AC
  Administered 2020-06-27: 500 mL via INTRAVENOUS

## 2020-06-27 NOTE — Discharge Instructions (Addendum)

## 2020-06-27 NOTE — ED Triage Notes (Signed)
Pt to triage via w/c with no distress noted; pt reports mid CP, nonradiating with no accomp symptoms x hr

## 2020-06-27 NOTE — ED Notes (Signed)
Pt given water and encouraged to drink fluids

## 2020-06-27 NOTE — ED Provider Notes (Signed)
[facility] Emergency Department Provider Note    First MD Initiated Contact with Patient 06/27/20 [phone]     (approximate)  I have reviewed the triage vital signs and the nursing notes.   HISTORY  Chief Complaint Chest Pain    HPI Gloria Wilkins is a 64 y.o. female with the below listed past medical history presents to the ER for evaluation of less than 1 day of epigastric pain and right upper quadrant pain associated with nausea and vomiting.  Denies any fevers or chills.  Did have a fatty dinner last night that she feels like she started most of her symptoms.  Says she does have a history of esophageal dysmotility and will frequently get food stuck in her throat but those symptoms are different from what she is currently experiencing.  She does still have her gallbladder.  Is not any blood thinners.  No lower abdominal pain.  Denies any chest pain or pressure.    Past Medical History:  Diagnosis Date  . Abnormal uterine bleeding (AUB)   . Anxiety   . Bacteremia due to Gram-positive bacteria   . DM (diabetes mellitus) ([place])   . Esophageal motility disorder   . Gastric polyps   . GERD (gastroesophageal reflux disease)   . HTN (hypertension)   . Hyperplastic colonic polyp   . Hypertriglyceridemia   . Hypometabolism   . Increased BMI   . Irregular Z line of esophagus   . Obesity   . Perimenopausal   . Tubular adenoma of colon    x 8  . Unstable bladder 12/16/2014  . Vaginal Pap smear, abnormal    Family History  Problem Relation Age of Onset  . Heart disease Father   . Breast cancer Maternal Aunt   . Breast cancer Daughter   . Breast cancer Maternal Aunt   . Diabetes Neg Hx   . Colon cancer Neg Hx   . Ovarian cancer Neg Hx    Past Surgical History:  Procedure Laterality Date  . ablation     endometrial  . BACK SURGERY    . CERVICAL DISCECTOMY    . COLONOSCOPY    . COLONOSCOPY N/A 07/25/2017   Procedure: COLONOSCOPY;  Surgeon:  Lollie Sails, MD;  Location: [facility] ENDOSCOPY;  Service: Endoscopy;  Laterality: N/A;  . esophagitis    . KNEE SURGERY Left   . LEEP    . TUBAL LIGATION     Patient Active Problem List   Diagnosis Date Noted  . Psychosocial stressors 09/28/2017  . Status post tubal ligation 09/22/2016  . Status post endometrial ablation 09/22/2016  . Sepsis ([place]) 08/13/2016  . Menopause 05/28/2016  . Hot flashes, menopausal 05/28/2016  . Obesity, Class III, BMI 40-49.9 (morbid obesity) ([place]) 05/28/2016  . Anxiety state 12/03/2014  . Acid reflux 12/03/2014  . Essential (primary) hypertension 12/03/2014  . Hypometabolism 12/03/2014  . Pure hyperglyceridemia 12/03/2014      Prior to Admission medications   Medication Sig Start Date End Date Taking? Authorizing Provider  cholecalciferol (VITAMIN D) 1000 UNITS tablet Take 1,000 Units by mouth daily.    [provider]  diclofenac (VOLTAREN) 50 MG EC tablet  08/14/15   [provider]  empagliflozin (JARDIANCE) 25 MG TABS tablet  02/13/18   [provider]  felodipine (PLENDIL) 5 MG 24 hr tablet Take 5 mg by mouth daily.    [provider]  losartan (COZAAR) 25 MG tablet Take by mouth.  [provider]  metFORMIN (GLUCOPHAGE) 500 MG tablet Take by mouth.    [provider]  nebivolol (BYSTOLIC) 5 MG tablet Take by mouth.    [provider]  pantoprazole (PROTONIX) 40 MG tablet  03/01/16   [provider]  Potassium 99 MG TABS Take by mouth.    [provider]  Semaglutide,0.25 or 0.5MG /DOS, (OZEMPIC, 0.25 OR 0.5 MG/DOSE,) 2 MG/1.5ML SOPN  02/13/18   [provider]    Allergies Vancomycin, Sulfa antibiotics, and Sulfasalazine    Social History Social History   Tobacco Use  . Smoking status: Former Smoker    Quit date: 11/15/2000    Years since quitting: 19.6  . Smokeless tobacco: Former Network engineer  . Vaping Use: Never used  Substance Use Topics   . Alcohol use: No  . Drug use: No    Review of Systems Patient denies headaches, rhinorrhea, blurry vision, numbness, shortness of breath, chest pain, edema, cough, abdominal pain, nausea, vomiting, diarrhea, dysuria, fevers, rashes or hallucinations unless otherwise stated above in HPI. ____________________________________________   PHYSICAL EXAM:  VITAL SIGNS: Vitals:   06/27/20 0900 06/27/20 0930  BP: 117/73 (!) 114/59  Pulse: 72 67  Resp: (!) 21 16  Temp:    SpO2: 97% 98%    Constitutional: Alert and oriented.  Eyes: Conjunctivae are normal.  Head: Atraumatic. Nose: No congestion/rhinnorhea. Mouth/Throat: Mucous membranes are moist.   Neck: No stridor. Painless ROM.  Cardiovascular: Normal rate, regular rhythm. Grossly normal heart sounds.  Good peripheral circulation. Respiratory: Normal respiratory effort.  No retractions. Lungs CTAB. Gastrointestinal: Soft with ttp in RUQ. No distention. No abdominal bruits. No CVA tenderness. Genitourinary:  Musculoskeletal: No lower extremity tenderness nor edema.  No joint effusions. Neurologic:  Normal speech and language. No gross focal neurologic deficits are appreciated. No facial droop Skin:  Skin is warm, dry and intact. No rash noted. Psychiatric: Mood and affect are normal. Speech and behavior are normal.  ____________________________________________   LABS (all labs ordered are listed, but only abnormal results are displayed)  Results for orders placed or performed during the hospital encounter of 06/27/20 (from the past 24 hour(s))  CBC with Differential     Status: None   Collection Time: 06/27/20  6:23 AM  Result Value Ref Range   WBC 7.8 4.0 - 10.5 K/uL   RBC 5.07 3.87 - 5.11 MIL/uL   Hemoglobin 14.9 12.0 - 15.0 g/dL   HCT 44.9 36 - 46 %   MCV 88.6 80.0 - 100.0 fL   MCH 29.4 26.0 - 34.0 pg   MCHC 33.2 30.0 - 36.0 g/dL   RDW 13.6 11.5 - 15.5 %   Platelets 276 150 - 400 K/uL   nRBC 0.0 0.0 - 0.2 %    Neutrophils Relative % 52 %   Neutro Abs 4.1 1.7 - 7.7 K/uL   Lymphocytes Relative 35 %   Lymphs Abs 2.7 0.7 - 4.0 K/uL   Monocytes Relative 9 %   Monocytes Absolute 0.7 0 - 1 K/uL   Eosinophils Relative 3 %   Eosinophils Absolute 0.3 0 - 0 K/uL   Basophils Relative 1 %   Basophils Absolute 0.1 0 - 0 K/uL   Immature Granulocytes 0 %   Abs Immature Granulocytes 0.03 0.00 - 0.07 K/uL  Comprehensive metabolic panel     Status: Abnormal   Collection Time: 06/27/20  6:23 AM  Result Value Ref Range   Sodium 141 135 - 145 mmol/L  Potassium 4.4 3.5 - 5.1 mmol/L   Chloride 104 98 - 111 mmol/L   CO2 25 22 - 32 mmol/L   Glucose, Bld 132 (H) 70 - 99 mg/dL   BUN 20 8 - 23 mg/dL   Creatinine, Ser 0.97 0.44 - 1.00 mg/dL   Calcium 9.2 8.9 - 10.3 mg/dL   Total Protein 7.5 6.5 - 8.1 g/dL   Albumin 4.3 3.5 - 5.0 g/dL   AST 41 15 - 41 U/L   ALT 36 0 - 44 U/L   Alkaline Phosphatase 75 38 - 126 U/L   Total Bilirubin 0.8 0.3 - 1.2 mg/dL   GFR calc non Af Amer >60 >60 mL/min   GFR calc Af Amer >60 >60 mL/min   Anion gap 12 5 - 15  Troponin I (High Sensitivity)     Status: None   Collection Time: 06/27/20  6:23 AM  Result Value Ref Range   Troponin I (High Sensitivity) <2 <18 ng/L  Troponin I (High Sensitivity)     Status: None   Collection Time: 06/27/20  8:23 AM  Result Value Ref Range   Troponin I (High Sensitivity) 2 <18 ng/L  Lipase, blood     Status: None   Collection Time: 06/27/20  8:23 AM  Result Value Ref Range   Lipase 43 11 - 51 U/L   ____________________________________________  EKG My review and personal interpretation at Time: 6:14   Indication: epigastric pain  Rate: 70  Rhythm: sinus Axis: normal Other: normal intervals, no stemi ____________________________________________  RADIOLOGY  I personally reviewed all radiographic images ordered to evaluate for the above acute complaints and reviewed radiology reports and findings.  These findings were personally discussed  with the patient.  Please see medical record for radiology report.  ____________________________________________   PROCEDURES  Procedure(s) performed:  Procedures    Critical Care performed: no ____________________________________________   INITIAL IMPRESSION / ASSESSMENT AND PLAN / ED COURSE  Pertinent labs & imaging results that were available during my care of the patient were reviewed by me and considered in my medical decision making (see chart for details).   DDX: Cholelithiasis, cholecystitis, cholangitis, pancreatitis, enteritis, gastritis, esophagitis, pneumonia, ACS  Gloria Wilkins is a 64 y.o. who presents to the ED with epigastric and right-sided abdominal pain as described above. She is afebrile nontoxic-appearing but is having vomiting will give IV fluids as well as IV narcotic medication and IV antiemetics. Will write order right upper quadrant ultrasound due to concern for biliary pathology. Have a lower suspicion for ACS given reassuring initial cardiac enzyme and EKG.  Clinical Course as of Jun 27 1012  Fri Jun 27, 2020  3016 Cardiac enzymes are negative.  Patient feels much improved.  Repeat abdominal exam is benign.  There is no evidence of cholecystitis but does she does have cholelithiasis and I suspect biliary colic.  Will p.o. challenge and if able to tolerate this will give referral to outpatient surgery   [PR]  1011 Patient tolerating p.o. She feels well and feels comfortable going home at this point I think that is appropriate and will give referral to outpatient surgery. We discussed strict return precautions and signs and symptoms which she should return to the ER.   [PR]    Clinical Course User Index [PR] Merlyn Lot, MD    The patient was evaluated in Emergency Department today for the symptoms described in the history of present illness. He/she was evaluated in the context of the [place] COVID-19  pandemic, which necessitated consideration  that the patient might be at risk for infection with the SARS-CoV-2 virus that causes COVID-19. Institutional protocols and algorithms that pertain to the evaluation of patients at risk for COVID-19 are in a state of rapid change based on information released by regulatory bodies including the CDC and federal and state organizations. These policies and algorithms were followed during the patient's care in the ED.  As part of my medical decision making, I reviewed the following data within the [place] notes reviewed and incorporated, Labs reviewed, notes from prior ED visits and  Controlled Substance Database   ____________________________________________   FINAL CLINICAL IMPRESSION(S) / ED DIAGNOSES  Final diagnoses:  RUQ pain  Calculus of gallbladder without cholecystitis without obstruction      NEW MEDICATIONS STARTED DURING THIS VISIT:  New Prescriptions   No medications on file     Note:  This document was prepared using Dragon voice recognition software and may include unintentional dictation errors.    Merlyn Lot, MD 06/27/20 1013

## 2020-10-14 ENCOUNTER — Other Ambulatory Visit: Payer: Self-pay

## 2020-10-14 ENCOUNTER — Other Ambulatory Visit (HOSPITAL_COMMUNITY)
Admission: RE | Admit: 2020-10-14 | Discharge: 2020-10-14 | Disposition: A | Discharge status: Home / Self Care | Payer: BC Managed Care – PPO | Source: Ambulatory Visit | Attending: Obstetrics and Gynecology | Admitting: Obstetrics and Gynecology

## 2020-10-14 ENCOUNTER — Ambulatory Visit (INDEPENDENT_AMBULATORY_CARE_PROVIDER_SITE_OTHER): Payer: BC Managed Care – PPO | Admitting: Obstetrics and Gynecology

## 2020-10-14 ENCOUNTER — Encounter: Payer: Self-pay | Admitting: Obstetrics and Gynecology

## 2020-10-14 VITALS — BP 115/75 | HR 80 | Ht 65.0 in | Wt 230.3 lb

## 2020-10-14 DIAGNOSIS — Z124 Encounter for screening for malignant neoplasm of cervix: Secondary | ICD-10-CM

## 2020-10-14 DIAGNOSIS — Z1231 Encounter for screening mammogram for malignant neoplasm of breast: Secondary | ICD-10-CM

## 2020-10-14 DIAGNOSIS — Z01419 Encounter for gynecological examination (general) (routine) without abnormal findings: Secondary | ICD-10-CM | POA: Diagnosis not present

## 2020-10-14 DIAGNOSIS — N3941 Urge incontinence: Secondary | ICD-10-CM

## 2020-10-14 MED ORDER — TOLTERODINE TARTRATE ER 2 MG PO CP24: 2.0000 mg | ORAL_CAPSULE | Freq: Every day | ORAL | 1 refills | Status: AC

## 2020-10-14 NOTE — Progress Notes (Signed)
HPI:      [name] is a 64 y.o. [phone] who LMP was No LMP recorded. Patient has had an ablation.  Subjective:   She presents today for her annual examination.  She is generally doing well at this time.  Was recently seen in the ED for having a large gallstone and pain but she has managed this with diet so far.  She is considering surgery. She continues to have some issues with stress incontinence but mainly complains of urge incontinence symptoms of having to go to the bathroom immediately and not making it in time.    Hx: The following portions of the patient's history were reviewed and updated as appropriate:             She  has a past medical history of Abnormal uterine bleeding (AUB), Anxiety, Bacteremia due to Gram-positive bacteria, DM (diabetes mellitus) ([place]), Esophageal motility disorder, Gastric polyps, GERD (gastroesophageal reflux disease), HTN (hypertension), Hyperplastic colonic polyp, Hypertriglyceridemia, Hypometabolism, Increased BMI, Irregular Z line of esophagus, Obesity, Perimenopausal, Tubular adenoma of colon, Unstable bladder (12/16/2014), and Vaginal Pap smear, abnormal. She does not have any pertinent problems on file. She  has a past surgical history that includes ablation; Cervical discectomy; LEEP; Tubal ligation; Knee surgery (Left); Back surgery; Colonoscopy; esophagitis; and Colonoscopy (N/A, 07/25/2017). Her family history includes Breast cancer in her daughter, maternal aunt, and maternal aunt; Heart disease in her father. She  reports that she quit smoking about 19 years ago. She has quit using smokeless tobacco. She reports that she does not drink alcohol and does not use drugs. She has a current medication list which includes the following prescription(s): cholecalciferol, diclofenac, felodipine, losartan, metformin, nebivolol, pantoprazole, potassium, ozempic (0.25 or 0.5 mg/dose), and vortioxetine hbr. She is allergic to vancomycin, sulfa  antibiotics, and sulfasalazine.       Review of Systems:  Review of Systems  Constitutional: Denied constitutional symptoms, night sweats, recent illness, fatigue, fever, insomnia and weight loss.  Eyes: Denied eye symptoms, eye pain, photophobia, vision change and visual disturbance.  Ears/Nose/Throat/Neck: Denied ear, nose, throat or neck symptoms, hearing loss, nasal discharge, sinus congestion and sore throat.  Cardiovascular: Denied cardiovascular symptoms, arrhythmia, chest pain/pressure, edema, exercise intolerance, orthopnea and palpitations.  Respiratory: Denied pulmonary symptoms, asthma, pleuritic pain, productive sputum, cough, dyspnea and wheezing.  Gastrointestinal: Denied, gastro-esophageal reflux, melena, nausea and vomiting.  Genitourinary: See HPI for additional information.  Musculoskeletal: Denied musculoskeletal symptoms, stiffness, swelling, muscle weakness and myalgia.  Dermatologic: Denied dermatology symptoms, rash and scar.  Neurologic: Denied neurology symptoms, dizziness, headache, neck pain and syncope.  Psychiatric: Denied psychiatric symptoms, anxiety and depression.  Endocrine: Denied endocrine symptoms including hot flashes and night sweats.   Meds:   Current Outpatient Medications on File Prior to Visit  Medication Sig Dispense Refill  . cholecalciferol (VITAMIN D) 1000 UNITS tablet Take 1,000 Units by mouth daily.    . diclofenac (VOLTAREN) 50 MG EC tablet     . felodipine (PLENDIL) 5 MG 24 hr tablet Take 5 mg by mouth daily.    Marland Kitchen losartan (COZAAR) 25 MG tablet Take by mouth.    . metFORMIN (GLUCOPHAGE) 500 MG tablet Take by mouth.    . nebivolol (BYSTOLIC) 5 MG tablet Take by mouth.    . pantoprazole (PROTONIX) 40 MG tablet     . Potassium 99 MG TABS Take by mouth.    . Semaglutide,0.25 or 0.5MG /DOS, (OZEMPIC, 0.25 OR 0.5 MG/DOSE,) 2 MG/1.5ML SOPN     .  vortioxetine HBr (TRINTELLIX) 5 MG TABS tablet Take 5 mg by mouth daily.     No current  facility-administered medications on file prior to visit.          Objective:     Vitals:   10/14/20 1357  BP: 115/75  Pulse: 80    Filed Weights   10/14/20 1357  Weight: 230 lb 4.8 oz (104.5 kg)              Physical examination General NAD, Conversant  HEENT Atraumatic; Op clear with mmm.  Normo-cephalic. Pupils reactive. Anicteric sclerae  Thyroid/Neck Smooth without nodularity or enlargement. Normal ROM.  Neck Supple.  Skin No rashes, lesions or ulceration. Normal palpated skin turgor. No nodularity.  Breasts: No masses or discharge.  Symmetric.  No axillary adenopathy.  Lungs: Clear to auscultation.No rales or wheezes. Normal Respiratory effort, no retractions.  Heart: NSR.  No murmurs or rubs appreciated. No periferal edema  Abdomen: Soft.  Non-tender.  No masses.  No HSM. No hernia  Extremities: Moves all appropriately.  Normal ROM for age. No lymphadenopathy.  Neuro: Oriented to PPT.  Normal mood. Normal affect.     Pelvic:   Vulva: Normal appearance.  No lesions.  Vagina: No lesions or abnormalities noted.  Support:  Second-degree cystocele second to third-degree rectocele.  Urethra No masses tenderness or scarring.  Meatus Normal size without lesions or prolapse.  Cervix: Normal appearance.  No lesions.  Anus: Normal exam.  No lesions.  Perineum: Normal exam.  No lesions.        Bimanual   Uterus: Normal size.  Non-tender.  Mobile.  AV.  Adnexae: No masses.  Non-tender to palpation.  Cul-de-sac: Negative for abnormality.      Assessment:    [id number] Patient Active Problem List   Diagnosis Date Noted  . Psychosocial stressors 09/28/2017  . Status post tubal ligation 09/22/2016  . Status post endometrial ablation 09/22/2016  . Sepsis ([place]) 08/13/2016  . Menopause 05/28/2016  . Hot flashes, menopausal 05/28/2016  . Obesity, Class III, BMI 40-49.9 (morbid obesity) ([place]) 05/28/2016  . Anxiety state 12/03/2014  . Acid reflux 12/03/2014  . Essential  (primary) hypertension 12/03/2014  . Hypometabolism 12/03/2014  . Pure hyperglyceridemia 12/03/2014     1. Well woman exam with routine gynecological exam   2. Screening mammogram for breast cancer   3. Urge incontinence     Patient describing urge incontinence.  She is interested in trying something to decrease the sensation/recurrence.   Plan:            1.  Basic Screening Recommendations The basic screening recommendations for asymptomatic women were discussed with the patient during her visit.  The age-appropriate recommendations were discussed with her and the rational for the tests reviewed.  When I am informed by the patient that another primary care physician has previously obtained the age-appropriate tests and they are up-to-date, only outstanding tests are ordered and referrals given as necessary.  Abnormal results of tests will be discussed with her when all of her results are completed.  Routine preventative health maintenance measures emphasized: Exercise/Diet/Weight control, Tobacco Warnings, Alcohol/Substance use risks and Stress Management Pap performed-mammogram ordered 2.  Detrol LA prescribed.  Will see patient for this in 6 weeks. Orders Orders Placed This Encounter  Procedures  . MM 3D SCREEN BREAST BILATERAL    No orders of the defined types were placed in this encounter.           F/U  Return in about 6 weeks (around 11/25/2020).  Finis Bud, M.D. 10/14/2020 2:24 PM

## 2020-10-17 LAB — CYTOLOGY - PAP
Comment: NEGATIVE
Diagnosis: NEGATIVE
High risk HPV: NEGATIVE

## 2020-10-22 ENCOUNTER — Ambulatory Visit: Payer: Self-pay | Admitting: General Surgery

## 2020-10-22 NOTE — H&P (View-Only) (Signed)
PATIENT PROFILE: Gloria Wilkins is a 64 y.o. female who presents to the Clinic for consultation at the request of Dr. Charlynn Grimes for evaluation of cholelithiasis.  PCP:  Lamonte Sakai, MD  HISTORY OF PRESENT ILLNESS: Gloria Wilkins reports she had an episode of acute abdominal pain in August.  The pain is started on the epigastric area and radiated to the right upper quadrant.  There is also radiated to her back.  It was associated with nausea and vomiting.  She went to the emergency room.  She was evaluated and she was found with gallstones without cholecystitis.  Her labs were normal that day.  She has been seen having intermittent right upper current pain.  Last acute episode was last weekend.  There has been no alleviating factor.  Aggravating factor is fatty food.  She denies fever or chills.  Ultrasound done at the ED on August showed cholelithiasis.  I personally evaluated the images.   PROBLEM LIST:         Problem List  Date Reviewed: 03/08/2017         Noted   Localized osteoarthritis of left knee 11/30/2017   Right elbow tendonitis 11/30/2017   Polyarthralgia, unspecified 08/03/2017   Chronic pain of left knee 08/03/2017   Overview    Prior complex meniscal tear in 2016      Generalized osteoarthritis of hand 08/03/2017   Obesity, morbid (CMS-HCC) 08/03/2017   Bacteremia due to Gram-positive bacteria 08/23/2016   Anxiety state 12/03/2014   Esophageal reflux 12/03/2014   Essential hypertension 12/03/2014   Pure hypertriglyceridemia 12/03/2014   Hypometabolism 12/03/2014      GENERAL REVIEW OF SYSTEMS:   General ROS: negative for - chills, fatigue, fever, weight gain or weight loss Allergy and Immunology ROS: negative for - hives  Hematological and Lymphatic ROS: negative for - bleeding problems or bruising, negative for palpable nodes Endocrine ROS: negative for - heat or cold intolerance, hair changes Respiratory ROS: negative for - cough, shortness of breath or  wheezing Cardiovascular ROS: no chest pain or palpitations GI ROS: negative for nausea, vomiting, diarrhea, constipation.  Positive for abdominal pain. Musculoskeletal ROS: negative for - joint swelling or muscle pain Neurological ROS: negative for - confusion, syncope Dermatological ROS: negative for pruritus and rash Psychiatric: negative for anxiety, depression, difficulty sleeping and memory loss  MEDICATIONS: Current Medications        Current Outpatient Medications  Medication Sig Dispense Refill  . cetirizine (ZYRTEC) 10 MG tablet once daily    . cholecalciferol (VITAMIN D3) 1,000 unit capsule Take 2,000 Units by mouth once daily.      . diclofenac (VOLTAREN) 50 MG EC tablet     . felodipine (PLENDIL) 5 MG ER tablet Take by mouth.    . fenofibrate 160 MG tablet Take by mouth.    . fluocinolone 0.01 % cream as needed    . fluocinonide (LIDEX) 0.05 % gel as needed    . fluticasone propionate (FLONASE) 50 mcg/actuation nasal spray as needed    . losartan (COZAAR) 25 MG tablet Take 25 mg by mouth once daily.    . metFORMIN (GLUCOPHAGE) 1000 MG tablet Take 1,000 mg by mouth 2 (two) times daily with meals.    . nebivolol (BYSTOLIC) 5 MG tablet Take by mouth.    . pantoprazole (PROTONIX) 40 MG DR tablet     . potassium 99 mg Tab Take by mouth.    . rosuvastatin (CRESTOR) 10 MG tablet nightly    .  semaglutide (OZEMPIC) 0.25 mg or 0.5 mg(2 mg/1.5 mL) pen injector Inject subcutaneously once a week    . tolterodine (DETROL LA) 2 MG LA capsule Take by mouth once daily    . vortioxetine (TRINTELLIX) 5 mg tablet Take 5 mg by mouth once daily    . sertraline (ZOLOFT) 50 MG tablet Take 50 mg by mouth once daily   (Patient not taking: Reported on 10/22/2020  )     No current facility-administered medications for this visit.      ALLERGIES: Vancomycin, Sulfa (sulfonamide antibiotics), and Sulfasalazine  PAST MEDICAL HISTORY:     Past Medical  History:  Diagnosis Date  . Diabetes (CMS-HCC)   . Diverticulosis 12/16/2014   sigmoid colon and descending colon  . Esophageal motility disorder 12/16/2014  . Esophagitis 12/16/2014  . Gastric polyp 12/16/2014   cystic fundic gland polyp  . GERD (gastroesophageal reflux disease)   . Hyperplastic colon polyp 12/16/2014   x 2  . Hypertension   . Irregular Z line of esophagus 12/16/2014  . Obesity   . Tubular adenoma of colon, unspecified 12/16/2014   x 8  . Unstable bladder     PAST SURGICAL HISTORY:      Past Surgical History:  Procedure Laterality Date  . BACK SURGERY  1990   Diskectomy  . BILATERAL TUBAL LIGATION    . CESAREAN SECTION    . COLONOSCOPY  12/16/2014   Multiple Tubular adenoma/Hyperplastic polyp/Repeat 61yrs/MUS  . COLONOSCOPY  07/25/2017   Tubular adenoma/Hyperplastic colon polyp/Repeat 20yrs/MUS  . EGD  12/16/2014   Esophagitis/No Repeat/MUS  . knee surgery Left 2016     FAMILY HISTORY:      Family History  Problem Relation Age of Onset  . Dementia Mother   . Myocardial Infarction (Heart attack) Father   . Breast cancer Maternal Aunt   . Breast cancer Maternal Aunt   . Colon cancer Neg Hx   . Colon polyps Neg Hx   . Liver cancer Neg Hx   . Liver disease Neg Hx   . Ulcers Neg Hx      SOCIAL HISTORY: Social History          Socioeconomic History  . Marital status: Married    Spouse name: Not on file  . Number of children: Not on file  . Years of education: Not on file  . Highest education level: Not on file  Occupational History  . Not on file  Tobacco Use  . Smoking status: Former Smoker    Quit date: 11/15/2000    Years since quitting: 19.9  . Smokeless tobacco: Never Used  Vaping Use  . Vaping Use: Never used  Substance and Sexual Activity  . Alcohol use: No  . Drug use: No  . Sexual activity: Not on file  Other Topics Concern  . Not on file  Social History Narrative  . Not on file   Social  Determinants of Health   Financial Resource Strain: Not on file  Food Insecurity: Not on file  Transportation Needs: Not on file      PHYSICAL EXAM:    Vitals:   10/22/20 0904  BP: (!) 148/93  Pulse: 73   Body mass index is 37.94 kg/m. Weight: (!) 103.4 kg (228 lb)   GENERAL: Alert, active, oriented x3  HEENT: Pupils equal reactive to light. Extraocular movements are intact. Sclera clear. Palpebral conjunctiva normal red color.Pharynx clear.  NECK: Supple with no palpable mass and no adenopathy.  LUNGS:  Sound clear with no rales rhonchi or wheezes.  HEART: Regular rhythm S1 and S2 without murmur.  ABDOMEN: Soft and depressible, nontender with no palpable mass, no hepatomegaly.   EXTREMITIES: Well-developed well-nourished symmetrical with no dependent edema.  NEUROLOGICAL: Awake alert oriented, facial expression symmetrical, moving all extremities.  REVIEW OF DATA: I have reviewed the following data today: No visits with results within 3 Month(s) from this visit.  Latest known visit with results is:  No results found for any previous visit.     ASSESSMENT: Gloria Wilkins is a 64 y.o. female presenting for consultation for cholelithiasis.   Patient was oriented about the diagnosis of cholelithiasis. Also oriented about what is the gallbladder, its anatomy and function and the implications of having stones. The patient was oriented about the treatment alternatives (observation vs cholecystectomy). Patient was oriented that a low percentage of patient will continue to have similar pain symptoms even after the gallbladder is removed. Surgical technique (open vs laparoscopic) was discussed. It was also discussed the goals of the surgery (decrease the pain episodes and avoid the risk of cholecystitis) and the risk of surgery including: bleeding, infection, common bile duct injury, stone retention, injury to other organs such as bowel, liver, stomach, other  complications such as hernia, bowel obstruction among others. Also discussed with patient about anesthesia and its complications such as: reaction to medications, pneumonia, heart complications, death, among others.  Cholelithiasis without cholecystitis [K80.20]  PLAN: 1. Robotic assisted laparoscopic cholecystectomy (55974) 2.  CBC, CMP (done 06/27/20) 3.  Do not take aspirin 5 days before the procedure 4.  Contact us if has any question or concern.   Patient verbalized understanding, all questions were answered, and were agreeable with the plan outlined above.    Herbert Pun, MD

## 2020-10-22 NOTE — H&P (Signed)
PATIENT PROFILE: Gloria Wilkins is a 64 y.o. female who presents to the Clinic for consultation at the request of Dr. Charlynn Grimes for evaluation of cholelithiasis.  PCP:  Gloria Sakai, MD  HISTORY OF PRESENT ILLNESS: Gloria Wilkins reports she had an episode of acute abdominal pain in August.  The pain is started on the epigastric area and radiated to the right upper quadrant.  There is also radiated to her back.  It was associated with nausea and vomiting.  She went to the emergency room.  She was evaluated and she was found with gallstones without cholecystitis.  Her labs were normal that day.  She has been seen having intermittent right upper current pain.  Last acute episode was last weekend.  There has been no alleviating factor.  Aggravating factor is fatty food.  She denies fever or chills.  Ultrasound done at the ED on August showed cholelithiasis.  I personally evaluated the images.   PROBLEM LIST:         Problem List  Date Reviewed: 03/08/2017         Noted   Localized osteoarthritis of left knee 11/30/2017   Right elbow tendonitis 11/30/2017   Polyarthralgia, unspecified 08/03/2017   Chronic pain of left knee 08/03/2017   Overview    Prior complex meniscal tear in 2016      Generalized osteoarthritis of hand 08/03/2017   Obesity, morbid (CMS-HCC) 08/03/2017   Bacteremia due to Gram-positive bacteria 08/23/2016   Anxiety state 12/03/2014   Esophageal reflux 12/03/2014   Essential hypertension 12/03/2014   Pure hypertriglyceridemia 12/03/2014   Hypometabolism 12/03/2014      GENERAL REVIEW OF SYSTEMS:   General ROS: negative for - chills, fatigue, fever, weight gain or weight loss Allergy and Immunology ROS: negative for - hives  Hematological and Lymphatic ROS: negative for - bleeding problems or bruising, negative for palpable nodes Endocrine ROS: negative for - heat or cold intolerance, hair changes Respiratory ROS: negative for - cough, shortness of breath or  wheezing Cardiovascular ROS: no chest pain or palpitations GI ROS: negative for nausea, vomiting, diarrhea, constipation.  Positive for abdominal pain. Musculoskeletal ROS: negative for - joint swelling or muscle pain Neurological ROS: negative for - confusion, syncope Dermatological ROS: negative for pruritus and rash Psychiatric: negative for anxiety, depression, difficulty sleeping and memory loss  MEDICATIONS: Current Medications        Current Outpatient Medications  Medication Sig Dispense Refill  . cetirizine (ZYRTEC) 10 MG tablet once daily    . cholecalciferol (VITAMIN D3) 1,000 unit capsule Take 2,000 Units by mouth once daily.      . diclofenac (VOLTAREN) 50 MG EC tablet     . felodipine (PLENDIL) 5 MG ER tablet Take by mouth.    . fenofibrate 160 MG tablet Take by mouth.    . fluocinolone 0.01 % cream as needed    . fluocinonide (LIDEX) 0.05 % gel as needed    . fluticasone propionate (FLONASE) 50 mcg/actuation nasal spray as needed    . losartan (COZAAR) 25 MG tablet Take 25 mg by mouth once daily.    . metFORMIN (GLUCOPHAGE) 1000 MG tablet Take 1,000 mg by mouth 2 (two) times daily with meals.    . nebivolol (BYSTOLIC) 5 MG tablet Take by mouth.    . pantoprazole (PROTONIX) 40 MG DR tablet     . potassium 99 mg Tab Take by mouth.    . rosuvastatin (CRESTOR) 10 MG tablet nightly    .  semaglutide (OZEMPIC) 0.25 mg or 0.5 mg(2 mg/1.5 mL) pen injector Inject subcutaneously once a week    . tolterodine (DETROL LA) 2 MG LA capsule Take by mouth once daily    . vortioxetine (TRINTELLIX) 5 mg tablet Take 5 mg by mouth once daily    . sertraline (ZOLOFT) 50 MG tablet Take 50 mg by mouth once daily   (Patient not taking: Reported on 10/22/2020  )     No current facility-administered medications for this visit.      ALLERGIES: Vancomycin, Sulfa (sulfonamide antibiotics), and Sulfasalazine  PAST MEDICAL HISTORY:     Past Medical  History:  Diagnosis Date  . Diabetes (CMS-HCC)   . Diverticulosis 12/16/2014   sigmoid colon and descending colon  . Esophageal motility disorder 12/16/2014  . Esophagitis 12/16/2014  . Gastric polyp 12/16/2014   cystic fundic gland polyp  . GERD (gastroesophageal reflux disease)   . Hyperplastic colon polyp 12/16/2014   x 2  . Hypertension   . Irregular Z line of esophagus 12/16/2014  . Obesity   . Tubular adenoma of colon, unspecified 12/16/2014   x 8  . Unstable bladder     PAST SURGICAL HISTORY:      Past Surgical History:  Procedure Laterality Date  . BACK SURGERY  1990   Diskectomy  . BILATERAL TUBAL LIGATION    . CESAREAN SECTION    . COLONOSCOPY  12/16/2014   Multiple Tubular adenoma/Hyperplastic polyp/Repeat 41yrs/MUS  . COLONOSCOPY  07/25/2017   Tubular adenoma/Hyperplastic colon polyp/Repeat 29yrs/MUS  . EGD  12/16/2014   Esophagitis/No Repeat/MUS  . knee surgery Left 2016     FAMILY HISTORY:      Family History  Problem Relation Age of Onset  . Dementia Mother   . Myocardial Infarction (Heart attack) Father   . Breast cancer Maternal Aunt   . Breast cancer Maternal Aunt   . Colon cancer Neg Hx   . Colon polyps Neg Hx   . Liver cancer Neg Hx   . Liver disease Neg Hx   . Ulcers Neg Hx      SOCIAL HISTORY: Social History          Socioeconomic History  . Marital status: Married    Spouse name: Not on file  . Number of children: Not on file  . Years of education: Not on file  . Highest education level: Not on file  Occupational History  . Not on file  Tobacco Use  . Smoking status: Former Smoker    Quit date: 11/15/2000    Years since quitting: 19.9  . Smokeless tobacco: Never Used  Vaping Use  . Vaping Use: Never used  Substance and Sexual Activity  . Alcohol use: No  . Drug use: No  . Sexual activity: Not on file  Other Topics Concern  . Not on file  Social History Narrative  . Not on file   Social  Determinants of Health   Financial Resource Strain: Not on file  Food Insecurity: Not on file  Transportation Needs: Not on file      PHYSICAL EXAM:    Vitals:   10/22/20 0904  BP: (!) 148/93  Pulse: 73   Body mass index is 37.94 kg/m. Weight: (!) 103.4 kg (228 lb)   GENERAL: Alert, active, oriented x3  HEENT: Pupils equal reactive to light. Extraocular movements are intact. Sclera clear. Palpebral conjunctiva normal red color.Pharynx clear.  NECK: Supple with no palpable mass and no adenopathy.  LUNGS:  Sound clear with no rales rhonchi or wheezes.  HEART: Regular rhythm S1 and S2 without murmur.  ABDOMEN: Soft and depressible, nontender with no palpable mass, no hepatomegaly.   EXTREMITIES: Well-developed well-nourished symmetrical with no dependent edema.  NEUROLOGICAL: Awake alert oriented, facial expression symmetrical, moving all extremities.  REVIEW OF DATA: I have reviewed the following data today: No visits with results within 3 Month(s) from this visit.  Latest known visit with results is:  No results found for any previous visit.     ASSESSMENT: Ms. Pandolfi is a 64 y.o. female presenting for consultation for cholelithiasis.   Patient was oriented about the diagnosis of cholelithiasis. Also oriented about what is the gallbladder, its anatomy and function and the implications of having stones. The patient was oriented about the treatment alternatives (observation vs cholecystectomy). Patient was oriented that a low percentage of patient will continue to have similar pain symptoms even after the gallbladder is removed. Surgical technique (open vs laparoscopic) was discussed. It was also discussed the goals of the surgery (decrease the pain episodes and avoid the risk of cholecystitis) and the risk of surgery including: bleeding, infection, common bile duct injury, stone retention, injury to other organs such as bowel, liver, stomach, other  complications such as hernia, bowel obstruction among others. Also discussed with patient about anesthesia and its complications such as: reaction to medications, pneumonia, heart complications, death, among others.  Cholelithiasis without cholecystitis [K80.20]  PLAN: 1. Robotic assisted laparoscopic cholecystectomy (57897) 2.  CBC, CMP (done 06/27/20) 3.  Do not take aspirin 5 days before the procedure 4.  Contact us if has any question or concern.   Patient verbalized understanding, all questions were answered, and were agreeable with the plan outlined above.    Herbert Pun, MD

## 2020-10-23 ENCOUNTER — Other Ambulatory Visit: Payer: Self-pay

## 2020-10-23 ENCOUNTER — Encounter
Admission: RE | Admit: 2020-10-23 | Discharge: 2020-10-23 | Disposition: A | Discharge status: Home / Self Care | Payer: BC Managed Care – PPO | Source: Ambulatory Visit | Attending: General Surgery | Admitting: General Surgery

## 2020-10-23 HISTORY — DX: Nausea with vomiting, unspecified: R11.2

## 2020-10-23 HISTORY — DX: Nausea with vomiting, unspecified: Z98.890

## 2020-10-23 HISTORY — DX: Other complications of anesthesia, initial encounter: T88.59XA

## 2020-10-23 NOTE — Patient Instructions (Signed)
Your procedure is scheduled on: 10/28/20 Report to [place]. YOU MUST CHECK IN AT [facility]. To find out your arrival time please call [phone] between 1PM - 3PM on 10/27/20.  Remember: Instructions that are not followed completely may result in serious medical risk, up to and including death, or upon the discretion of your surgeon and anesthesiologist your surgery may need to be rescheduled.     _X__ 1. Do not eat food after midnight the night before your procedure.                 No gum chewing or hard candies. You may drink clear liquids up to 2 hours                 before you are scheduled to arrive for your surgery- DO not drink clear                 liquids within 2 hours of the start of your surgery.                 Clear Liquids include:  water, apple juice without pulp, clear carbohydrate                 drink such as Clearfast or Gatorade, Black Coffee or Tea (Do not add                 anything to coffee or tea). Diabetics water only  __X__2.  On the morning of surgery brush your teeth with toothpaste and water, you                 may rinse your mouth with mouthwash if you wish.  Do not swallow any              toothpaste of mouthwash.     _X__ 3.  No Alcohol for 24 hours before or after surgery.   _X__ 4.  Do Not Smoke or use e-cigarettes For 24 Hours Prior to Your Surgery.                 Do not use any chewable tobacco products for at least 6 hours prior to                 surgery.  ____  5.  Bring all medications with you on the day of surgery if instructed.   __X__  6.  Notify your doctor if there is any change in your medical condition      (cold, fever, infections).     Do not wear jewelry, make-up, hairpins, clips or nail polish. Do not wear lotions, powders, or perfumes.  Do not shave 48 hours prior to surgery. Men may shave face and neck. Do not bring valuables to the hospital.    [facility] is not responsible for any belongings or valuables.  Contacts, dentures/partials or body piercings may not be worn into surgery. Bring a case for your contacts, glasses or hearing aids, a denture cup will be supplied. Leave your suitcase in the car. After surgery it may be brought to your room. For patients admitted to the hospital, discharge time is determined by your treatment team.   Patients discharged the day of surgery will not be allowed to drive home.   Please read over the following fact sheets that you were given:   MRSA Information  __X__ Take these medicines the morning of  surgery with A SIP OF WATER:    1. felodipine (PLENDIL) 5 MG 24 hr tablet  2. nebivolol (BYSTOLIC) 5 MG tablet  3. pantoprazole (PROTONIX) 40 MG tablet  4. tolterodine (DETROL LA) 2 MG 24 hr capsule  5.  6.  ____ Fleet Enema (as directed)   __X__ Use CHG Soap/SAGE wipes as directed  ____ Use inhalers on the day of surgery  ____ Stop metformin/Janumet/Farxiga 2 days prior to surgery    ____ Take 1/2 of usual insulin dose the night before surgery. No insulin the morning          of surgery.   ____ Stop Blood Thinners Coumadin/Plavix/Xarelto/Pleta/Pradaxa/Eliquis/Effient/Aspirin  on   Or contact your Surgeon, Cardiologist or Medical Doctor regarding  ability to stop your blood thinners  __X__ Stop Anti-inflammatories 7 days before surgery such as Advil, Ibuprofen, Motrin,  BC or Goodies Powder, Naprosyn, Naproxen, Aleve, Aspirin    __X__ Stop all herbal supplements, fish oil or vitamin E until after surgery.    ____ Bring C-Pap to the hospital.

## 2020-10-24 ENCOUNTER — Other Ambulatory Visit
Admission: RE | Admit: 2020-10-24 | Discharge: 2020-10-24 | Disposition: A | Discharge status: Home / Self Care | Payer: BC Managed Care – PPO | Source: Ambulatory Visit | Attending: General Surgery | Admitting: General Surgery

## 2020-10-24 DIAGNOSIS — Z01812 Encounter for preprocedural laboratory examination: Secondary | ICD-10-CM | POA: Insufficient documentation

## 2020-10-24 DIAGNOSIS — Z20822 Contact with and (suspected) exposure to covid-19: Secondary | ICD-10-CM | POA: Insufficient documentation

## 2020-10-24 LAB — BASIC METABOLIC PANEL
Anion gap: 10 (ref 5–15)
BUN: 17 mg/dL (ref 8–23)
CO2: 28 mmol/L (ref 22–32)
Calcium: 9.3 mg/dL (ref 8.9–10.3)
Chloride: 103 mmol/L (ref 98–111)
Creatinine, Ser: 0.72 mg/dL (ref 0.44–1.00)
GFR, Estimated: >60 mL/min (ref >60)
Glucose, Bld: 116 mg/dL — ABNORMAL HIGH (ref 70–99)
Potassium: 4.2 mmol/L (ref 3.5–5.1)
Sodium: 141 mmol/L (ref 135–145)

## 2020-10-24 LAB — CBC
HCT: 44.8 % (ref 36.0–46.0)
Hemoglobin: 14.7 g/dL (ref 12.0–15.0)
MCH: 30.4 pg (ref 26.0–34.0)
MCHC: 32.8 g/dL (ref 30.0–36.0)
MCV: 92.6 fL (ref 80.0–100.0)
Platelets: 281 10*3/uL (ref 150–400)
RBC: 4.84 MIL/uL (ref 3.87–5.11)
RDW: 13.9 % (ref 11.5–15.5)
WBC: 9.6 10*3/uL (ref 4.0–10.5)
nRBC: 0 % (ref 0.0–0.2)

## 2020-10-25 LAB — SARS CORONAVIRUS 2 (TAT 6-24 HRS): SARS Coronavirus 2: NEGATIVE

## 2020-10-28 ENCOUNTER — Other Ambulatory Visit: Payer: Self-pay

## 2020-10-28 ENCOUNTER — Encounter
Admission: RE | Disposition: A | Discharge status: Home / Self Care | Payer: Self-pay | Source: Home / Self Care | Attending: General Surgery

## 2020-10-28 ENCOUNTER — Encounter: Payer: Self-pay | Admitting: General Surgery

## 2020-10-28 ENCOUNTER — Ambulatory Visit: Payer: BC Managed Care – PPO | Admitting: Anesthesiology

## 2020-10-28 ENCOUNTER — Ambulatory Visit
Admission: RE | Admit: 2020-10-28 | Discharge: 2020-10-28 | Disposition: A | Discharge status: Home / Self Care | Payer: BC Managed Care – PPO | Attending: General Surgery | Admitting: General Surgery

## 2020-10-28 DIAGNOSIS — Z79899 Other long term (current) drug therapy: Secondary | ICD-10-CM | POA: Diagnosis not present

## 2020-10-28 DIAGNOSIS — K8010 Calculus of gallbladder with chronic cholecystitis without obstruction: Secondary | ICD-10-CM | POA: Diagnosis present

## 2020-10-28 DIAGNOSIS — Z882 Allergy status to sulfonamides status: Secondary | ICD-10-CM | POA: Diagnosis not present

## 2020-10-28 DIAGNOSIS — Z7984 Long term (current) use of oral hypoglycemic drugs: Secondary | ICD-10-CM | POA: Insufficient documentation

## 2020-10-28 DIAGNOSIS — Z87891 Personal history of nicotine dependence: Secondary | ICD-10-CM | POA: Diagnosis not present

## 2020-10-28 DIAGNOSIS — Z881 Allergy status to other antibiotic agents status: Secondary | ICD-10-CM | POA: Insufficient documentation

## 2020-10-28 LAB — GLUCOSE, CAPILLARY
Glucose-Capillary: 114 mg/dL — ABNORMAL HIGH (ref 70–99)
Glucose-Capillary: 134 mg/dL — ABNORMAL HIGH (ref 70–99)

## 2020-10-28 SURGERY — CHOLECYSTECTOMY, ROBOT-ASSISTED, LAPAROSCOPIC
Anesthesia: General | Site: Abdomen

## 2020-10-28 MED ORDER — PROPOFOL 10 MG/ML IV BOLUS
INTRAVENOUS | Status: DC | PRN
  Administered 2020-10-28: 150 mg via INTRAVENOUS

## 2020-10-28 MED ORDER — PHENYLEPHRINE HCL (PRESSORS) 10 MG/ML IV SOLN
INTRAVENOUS | Status: AC
  Filled 2020-10-28: qty 1

## 2020-10-28 MED ORDER — CHLORHEXIDINE GLUCONATE 0.12 % MT SOLN: 15.0000 mL | Freq: Once | OROMUCOSAL | Status: AC

## 2020-10-28 MED ORDER — CHLORHEXIDINE GLUCONATE 0.12 % MT SOLN
OROMUCOSAL | Status: AC
  Administered 2020-10-28: 11:00:00 15 mL via OROMUCOSAL
  Filled 2020-10-28: qty 15

## 2020-10-28 MED ORDER — OXYCODONE HCL 5 MG PO TABS
5.0000 mg | ORAL_TABLET | Freq: Once | ORAL | Status: AC | PRN
  Administered 2020-10-28: 5 mg via ORAL

## 2020-10-28 MED ORDER — OXYCODONE HCL 5 MG PO TABS
ORAL_TABLET | ORAL | Status: AC
  Filled 2020-10-28: qty 1

## 2020-10-28 MED ORDER — MIDAZOLAM HCL 2 MG/2ML IJ SOLN
INTRAMUSCULAR | Status: DC | PRN
  Administered 2020-10-28: 2 mg via INTRAVENOUS

## 2020-10-28 MED ORDER — FAMOTIDINE 20 MG PO TABS
ORAL_TABLET | ORAL | Status: AC
  Administered 2020-10-28: 12:00:00 20 mg via ORAL
  Filled 2020-10-28: qty 1

## 2020-10-28 MED ORDER — SODIUM CHLORIDE 0.9 % IV SOLN: INTRAVENOUS | Status: DC

## 2020-10-28 MED ORDER — PROMETHAZINE HCL 25 MG/ML IJ SOLN: 6.2500 mg | INTRAMUSCULAR | Status: DC | PRN

## 2020-10-28 MED ORDER — PROMETHAZINE HCL 25 MG/ML IJ SOLN
INTRAMUSCULAR | Status: AC
  Administered 2020-10-28: 16:00:00 6.25 mg via INTRAVENOUS
  Filled 2020-10-28: qty 1

## 2020-10-28 MED ORDER — DEXMEDETOMIDINE (PRECEDEX) IN NS 20 MCG/5ML (4 MCG/ML) IV SYRINGE
PREFILLED_SYRINGE | INTRAVENOUS | Status: AC
  Filled 2020-10-28: qty 5

## 2020-10-28 MED ORDER — LIDOCAINE HCL (PF) 2 % IJ SOLN
INTRAMUSCULAR | Status: AC
  Filled 2020-10-28: qty 5

## 2020-10-28 MED ORDER — FENTANYL CITRATE (PF) 100 MCG/2ML IJ SOLN
25.0000 ug | INTRAMUSCULAR | Status: DC | PRN
  Administered 2020-10-28 (×2): 25 ug via INTRAVENOUS
  Administered 2020-10-28: 50 ug via INTRAVENOUS

## 2020-10-28 MED ORDER — FENTANYL CITRATE (PF) 100 MCG/2ML IJ SOLN
INTRAMUSCULAR | Status: DC | PRN
  Administered 2020-10-28: 50 ug via INTRAVENOUS

## 2020-10-28 MED ORDER — SUGAMMADEX SODIUM 500 MG/5ML IV SOLN
INTRAVENOUS | Status: DC | PRN
  Administered 2020-10-28: 500 mg via INTRAVENOUS

## 2020-10-28 MED ORDER — DEXMEDETOMIDINE (PRECEDEX) IN NS 20 MCG/5ML (4 MCG/ML) IV SYRINGE
PREFILLED_SYRINGE | INTRAVENOUS | Status: DC | PRN
  Administered 2020-10-28: 12 ug via INTRAVENOUS
  Administered 2020-10-28: 8 ug via INTRAVENOUS

## 2020-10-28 MED ORDER — SUGAMMADEX SODIUM 500 MG/5ML IV SOLN
INTRAVENOUS | Status: AC
  Filled 2020-10-28: qty 5

## 2020-10-28 MED ORDER — GLYCOPYRROLATE 0.2 MG/ML IJ SOLN
INTRAMUSCULAR | Status: AC
  Filled 2020-10-28: qty 1

## 2020-10-28 MED ORDER — MIDAZOLAM HCL 2 MG/2ML IJ SOLN
INTRAMUSCULAR | Status: AC
  Filled 2020-10-28: qty 2

## 2020-10-28 MED ORDER — OXYCODONE HCL 5 MG/5ML PO SOLN: 5.0000 mg | Freq: Once | ORAL | Status: AC | PRN

## 2020-10-28 MED ORDER — SODIUM CHLORIDE FLUSH 0.9 % IV SOLN
INTRAVENOUS | Status: AC
  Filled 2020-10-28: qty 10

## 2020-10-28 MED ORDER — PHENYLEPHRINE HCL (PRESSORS) 10 MG/ML IV SOLN
INTRAVENOUS | Status: DC | PRN
  Administered 2020-10-28: 200 ug via INTRAVENOUS
  Administered 2020-10-28: 100 ug via INTRAVENOUS

## 2020-10-28 MED ORDER — FENTANYL CITRATE (PF) 100 MCG/2ML IJ SOLN
INTRAMUSCULAR | Status: AC
  Filled 2020-10-28: qty 2

## 2020-10-28 MED ORDER — FAMOTIDINE 20 MG PO TABS: 20.0000 mg | ORAL_TABLET | Freq: Once | ORAL | Status: AC

## 2020-10-28 MED ORDER — CEFAZOLIN SODIUM-DEXTROSE 2-4 GM/100ML-% IV SOLN
INTRAVENOUS | Status: AC
  Filled 2020-10-28: qty 100

## 2020-10-28 MED ORDER — HYDROCODONE-ACETAMINOPHEN 5-325 MG PO TABS
1.0000 | ORAL_TABLET | ORAL | 0 refills | Status: AC | PRN
Start: 2020-10-28 — End: 2020-10-31

## 2020-10-28 MED ORDER — SCOPOLAMINE 1 MG/3DAYS TD PT72
MEDICATED_PATCH | TRANSDERMAL | Status: AC
  Administered 2020-10-28: 12:00:00 1.5 mg via TRANSDERMAL
  Filled 2020-10-28: qty 1

## 2020-10-28 MED ORDER — INDOCYANINE GREEN 25 MG IV SOLR
1.2500 mg | Freq: Once | INTRAVENOUS | Status: AC
  Administered 2020-10-28: 11:00:00 1.25 mg via INTRAVENOUS
  Filled 2020-10-28: qty 0.5

## 2020-10-28 MED ORDER — DEXAMETHASONE SODIUM PHOSPHATE 10 MG/ML IJ SOLN
INTRAMUSCULAR | Status: AC
  Filled 2020-10-28: qty 1

## 2020-10-28 MED ORDER — ROCURONIUM BROMIDE 100 MG/10ML IV SOLN
INTRAVENOUS | Status: DC | PRN
  Administered 2020-10-28: 60 mg via INTRAVENOUS

## 2020-10-28 MED ORDER — ONDANSETRON HCL 4 MG/2ML IJ SOLN
INTRAMUSCULAR | Status: DC | PRN
  Administered 2020-10-28 (×2): 4 mg via INTRAVENOUS

## 2020-10-28 MED ORDER — LIDOCAINE HCL (CARDIAC) PF 100 MG/5ML IV SOSY
PREFILLED_SYRINGE | INTRAVENOUS | Status: DC | PRN
  Administered 2020-10-28: 100 mg via INTRAVENOUS

## 2020-10-28 MED ORDER — BUPIVACAINE-EPINEPHRINE 0.25% -1:200000 IJ SOLN
INTRAMUSCULAR | Status: DC | PRN
  Administered 2020-10-28: 30 mL

## 2020-10-28 MED ORDER — BUPIVACAINE-EPINEPHRINE (PF) 0.25% -1:200000 IJ SOLN
INTRAMUSCULAR | Status: AC
  Filled 2020-10-28: qty 30

## 2020-10-28 MED ORDER — ONDANSETRON HCL 4 MG/2ML IJ SOLN
INTRAMUSCULAR | Status: AC
  Filled 2020-10-28: qty 4

## 2020-10-28 MED ORDER — GLYCOPYRROLATE 0.2 MG/ML IJ SOLN
INTRAMUSCULAR | Status: DC | PRN
  Administered 2020-10-28: .2 mg via INTRAVENOUS

## 2020-10-28 MED ORDER — CEFAZOLIN SODIUM-DEXTROSE 2-4 GM/100ML-% IV SOLN
2.0000 g | INTRAVENOUS | Status: AC
  Administered 2020-10-28: 12:00:00 2 g via INTRAVENOUS

## 2020-10-28 MED ORDER — SCOPOLAMINE 1 MG/3DAYS TD PT72: 1.0000 | MEDICATED_PATCH | TRANSDERMAL | Status: DC

## 2020-10-28 MED ORDER — ACETAMINOPHEN 10 MG/ML IV SOLN
INTRAVENOUS | Status: DC | PRN
  Administered 2020-10-28: 1000 mg via INTRAVENOUS

## 2020-10-28 MED ORDER — PROPOFOL 10 MG/ML IV BOLUS
INTRAVENOUS | Status: AC
  Filled 2020-10-28: qty 20

## 2020-10-28 MED ORDER — ACETAMINOPHEN 10 MG/ML IV SOLN
INTRAVENOUS | Status: AC
  Filled 2020-10-28: qty 100

## 2020-10-28 MED ORDER — SEVOFLURANE IN SOLN
RESPIRATORY_TRACT | Status: AC
  Filled 2020-10-28: qty 250

## 2020-10-28 MED ORDER — ROCURONIUM BROMIDE 10 MG/ML (PF) SYRINGE
PREFILLED_SYRINGE | INTRAVENOUS | Status: AC
  Filled 2020-10-28: qty 10

## 2020-10-28 MED ORDER — DEXAMETHASONE SODIUM PHOSPHATE 10 MG/ML IJ SOLN
INTRAMUSCULAR | Status: DC | PRN
  Administered 2020-10-28: 10 mg via INTRAVENOUS

## 2020-10-28 MED ORDER — ORAL CARE MOUTH RINSE: 15.0000 mL | Freq: Once | OROMUCOSAL | Status: AC

## 2020-10-28 SURGICAL SUPPLY — 57 items
ADH SKN CLS APL DERMABOND .7 (GAUZE/BANDAGES/DRESSINGS) ×2
APL PRP STRL LF DISP 70% ISPRP (MISCELLANEOUS) ×2
BAG INFUSER PRESSURE 100CC (MISCELLANEOUS) IMPLANT
BAG SPEC RTRVL LRG 6X4 10 (ENDOMECHANICALS) ×2
BLADE SURG SZ11 CARB STEEL (BLADE) ×4 IMPLANT
CANISTER SUCT 1200ML W/VALVE (MISCELLANEOUS) ×4 IMPLANT
CANNULA REDUC XI 12-8 STAPL (CANNULA) ×1
CANNULA REDUC XI 12-8MM STAPL (CANNULA) ×1
CANNULA REDUCER 12-8 DVNC XI (CANNULA) ×2 IMPLANT
CHLORAPREP W/TINT 26 (MISCELLANEOUS) ×4 IMPLANT
CLIP VESOLOCK MED LG 6/CT (CLIP) ×4 IMPLANT
COVER WAND RF STERILE (DRAPES) ×4 IMPLANT
DECANTER SPIKE VIAL GLASS SM (MISCELLANEOUS) ×8 IMPLANT
DEFOGGER SCOPE WARMER CLEARIFY (MISCELLANEOUS) ×4 IMPLANT
DERMABOND ADVANCED (GAUZE/BANDAGES/DRESSINGS) ×2
DERMABOND ADVANCED .7 DNX12 (GAUZE/BANDAGES/DRESSINGS) ×2 IMPLANT
DRAPE ARM DVNC X/XI (DISPOSABLE) ×8 IMPLANT
DRAPE COLUMN DVNC XI (DISPOSABLE) ×2 IMPLANT
DRAPE DA VINCI XI ARM (DISPOSABLE) ×8
DRAPE DA VINCI XI COLUMN (DISPOSABLE) ×2
ELECT REM PT RETURN 9FT ADLT (ELECTROSURGICAL) ×4
ELECTRODE REM PT RTRN 9FT ADLT (ELECTROSURGICAL) ×2 IMPLANT
GLOVE BIO SURGEON STRL SZ 6.5 (GLOVE) ×18 IMPLANT
GLOVE BIO SURGEONS STRL SZ 6.5 (GLOVE) ×6
GLOVE BIOGEL PI IND STRL 6.5 (GLOVE) ×4 IMPLANT
GLOVE BIOGEL PI INDICATOR 6.5 (GLOVE) ×4
GOWN STRL REUS W/ TWL LRG LVL3 (GOWN DISPOSABLE) ×6 IMPLANT
GOWN STRL REUS W/TWL LRG LVL3 (GOWN DISPOSABLE) ×12
GRASPER SUT TROCAR 14GX15 (MISCELLANEOUS) IMPLANT
IRRIGATOR SUCT 8 DISP DVNC XI (IRRIGATION / IRRIGATOR) IMPLANT
IRRIGATOR SUCTION 8MM XI DISP (IRRIGATION / IRRIGATOR)
IV NS 1000ML (IV SOLUTION)
IV NS 1000ML BAXH (IV SOLUTION) IMPLANT
KIT PINK PAD W/HEAD ARE REST (MISCELLANEOUS) ×4
KIT PINK PAD W/HEAD ARM REST (MISCELLANEOUS) ×2 IMPLANT
LABEL OR SOLS (LABEL) ×4 IMPLANT
MANIFOLD NEPTUNE II (INSTRUMENTS) ×4 IMPLANT
NEEDLE HYPO 22GX1.5 SAFETY (NEEDLE) ×4 IMPLANT
NEEDLE INSUFFLATION 14GA 120MM (NEEDLE) ×4 IMPLANT
NS IRRIG 500ML POUR BTL (IV SOLUTION) ×4 IMPLANT
OBTURATOR OPTICAL STANDARD 8MM (TROCAR) ×2
OBTURATOR OPTICAL STND 8 DVNC (TROCAR) ×2
OBTURATOR OPTICALSTD 8 DVNC (TROCAR) ×2 IMPLANT
PACK LAP CHOLECYSTECTOMY (MISCELLANEOUS) ×4 IMPLANT
POUCH SPECIMEN RETRIEVAL 10MM (ENDOMECHANICALS) ×4 IMPLANT
SEAL CANN UNIV 5-8 DVNC XI (MISCELLANEOUS) ×6 IMPLANT
SEAL XI 5MM-8MM UNIVERSAL (MISCELLANEOUS) ×6
SET TUBE SMOKE EVAC HIGH FLOW (TUBING) ×4 IMPLANT
SOLUTION ELECTROLUBE (MISCELLANEOUS) ×4 IMPLANT
STAPLER CANNULA SEAL DVNC XI (STAPLE) ×2 IMPLANT
STAPLER CANNULA SEAL XI (STAPLE) ×2
SUT MNCRL 4-0 (SUTURE) ×4
SUT MNCRL 4-0 27XMFL (SUTURE) ×2
SUT VIC AB 3-0 SH 27 (SUTURE) ×4
SUT VIC AB 3-0 SH 27X BRD (SUTURE) ×2 IMPLANT
SUT VICRYL 0 AB UR-6 (SUTURE) IMPLANT
SUTURE MNCRL 4-0 27XMF (SUTURE) ×2 IMPLANT

## 2020-10-28 NOTE — Anesthesia Postprocedure Evaluation (Signed)
Anesthesia Post Note  Patient: Gloria Wilkins  Procedure(s) Performed: XI ROBOTIC ASSISTED LAPAROSCOPIC CHOLECYSTECTOMY (N/A Abdomen) INDOCYANINE GREEN FLUORESCENCE IMAGING (ICG)  Patient location during evaluation: PACU Anesthesia Type: General Level of consciousness: awake and alert Pain management: pain level controlled Vital Signs Assessment: post-procedure vital signs reviewed and stable Respiratory status: spontaneous breathing, nonlabored ventilation, respiratory function stable and patient connected to nasal cannula oxygen Cardiovascular status: blood pressure returned to baseline and stable Postop Assessment: no apparent nausea or vomiting Anesthetic complications: no   No complications documented.   Last Vitals:  Vitals:   10/28/20 1500 10/28/20 1510  BP: 112/70 114/64  Pulse: 74 79  Resp: 14 16  Temp:  36.9 C  SpO2: 97% 96%    Last Pain:  Vitals:   10/28/20 1510  TempSrc: Temporal  PainSc: [place]

## 2020-10-28 NOTE — Anesthesia Preprocedure Evaluation (Signed)
Anesthesia Evaluation  Patient identified by MRN, date of birth, ID band Patient awake    Reviewed: Allergy & Precautions, H&P , NPO status , Patient's Chart, lab work & pertinent test results  History of Anesthesia Complications (+) PONV and history of anesthetic complications  Airway Mallampati: III  TM Distance: <3 FB Neck ROM: limited    Dental  (+) Chipped, Poor Dentition, Missing   Pulmonary neg shortness of breath, former smoker,    Pulmonary exam normal        Cardiovascular Exercise Tolerance: Good hypertension, Normal cardiovascular exam     Neuro/Psych PSYCHIATRIC DISORDERS negative neurological ROS     GI/Hepatic Neg liver ROS, GERD  Medicated and Controlled,  Endo/Other  diabetes, Type 2  Renal/GU      Musculoskeletal   Abdominal   Peds  Hematology negative hematology ROS (+)   Anesthesia Other Findings Past Medical History: No date: Abnormal uterine bleeding (AUB) No date: Anxiety No date: Bacteremia due to Gram-positive bacteria No date: Complication of anesthesia No date: DM (diabetes mellitus) (HCC) No date: Esophageal motility disorder No date: Gastric polyps No date: GERD (gastroesophageal reflux disease) No date: HTN (hypertension) No date: Hyperplastic colonic polyp No date: Hypertriglyceridemia No date: Hypometabolism No date: Increased BMI No date: Irregular Z line of esophagus No date: Obesity No date: Perimenopausal No date: PONV (postoperative nausea and vomiting) No date: Tubular adenoma of colon     Comment:  x 8 12/16/2014: Unstable bladder No date: Vaginal Pap smear, abnormal  Past Surgical History: No date: ablation     Comment:  endometrial No date: BACK SURGERY     Comment:  L5-S1 DISCECTOMY No date: CERVICAL DISCECTOMY No date: CESAREAN No date: COLONOSCOPY 07/25/2017: COLONOSCOPY; N/A     Comment:  Procedure: COLONOSCOPY;  Surgeon: Lollie Sails,                MD;  Location: [facility] ENDOSCOPY;  Service: Endoscopy;                Laterality: N/A; No date: esophagitis No date: KNEE SURGERY; Left No date: LEEP No date: TUBAL LIGATION  BMI    Body Mass Index: 37.44 kg/m      Reproductive/Obstetrics negative OB ROS                             Anesthesia Physical Anesthesia Plan  ASA: III  Anesthesia Plan: General ETT   Post-op Pain Management:    Induction: Intravenous  PONV Risk Score and Plan: Ondansetron, Dexamethasone, Midazolam and Treatment may vary due to age or medical condition  Airway Management Planned: Oral ETT  Additional Equipment:   Intra-op Plan:   Post-operative Plan: Extubation in OR  Informed Consent: I have reviewed the patients History and Physical, chart, labs and discussed the procedure including the risks, benefits and alternatives for the proposed anesthesia with the patient or authorized representative who has indicated his/her understanding and acceptance.     Dental Advisory Given  Plan Discussed with: Anesthesiologist, CRNA and Surgeon  Anesthesia Plan Comments: (Patient consented for risks of anesthesia including but not limited to:  - adverse reactions to medications - damage to eyes, teeth, lips or other oral mucosa - nerve damage due to positioning  - sore throat or hoarseness - Damage to heart, brain, nerves, lungs, other parts of body or loss of life  Patient voiced understanding.)        Anesthesia Quick  Evaluation

## 2020-10-28 NOTE — Interval H&P Note (Signed)
History and Physical Interval Note:  10/28/2020 11:35 AM  Gloria Wilkins  has presented today for surgery, with the diagnosis of K80.20 Cholelithiasis w/o cholecystitis.  The various methods of treatment have been discussed with the patient and family. After consideration of risks, benefits and other options for treatment, the patient has consented to  Procedure(s): XI ROBOTIC Cole[place] (N/A) as a surgical intervention.  The patient's history has been reviewed, patient examined, no change in status, stable for surgery.  I have reviewed the patient's chart and labs.  Questions were answered to the patient's satisfaction.     Herbert Pun

## 2020-10-28 NOTE — Anesthesia Procedure Notes (Signed)
Procedure Name: Intubation Date/Time: 10/28/2020 12:03 PM Performed by: Doreen Salvage, CRNA Pre-anesthesia Checklist: Patient identified, Patient being monitored, Timeout performed, Emergency Drugs available and Suction available Patient Re-evaluated:Patient Re-evaluated prior to induction Oxygen Delivery Method: Circle system utilized Preoxygenation: Pre-oxygenation with 100% oxygen Induction Type: IV induction Ventilation: Mask ventilation without difficulty Laryngoscope Size: Mac, 3 and McGraph Grade View: Grade I Tube type: Oral Tube size: 7.0 mm Number of attempts: 1 Airway Equipment and Method: Stylet Placement Confirmation: ETT inserted through vocal cords under direct vision,  positive ETCO2 and breath sounds checked- equal and bilateral Secured at: 21 cm Tube secured with: Tape Dental Injury: Teeth and Oropharynx as per pre-operative assessment

## 2020-10-28 NOTE — Transfer of Care (Signed)
Immediate Anesthesia Transfer of Care Note  Patient: Gloria Wilkins  Procedure(s) Performed: Procedure(s): XI ROBOTIC ASSISTED LAPAROSCOPIC CHOLECYSTECTOMY (N/A) INDOCYANINE GREEN FLUORESCENCE IMAGING (ICG)  Patient Location: PACU  Anesthesia Type:General  Level of Consciousness: sedated  Airway & Oxygen Therapy: Patient Spontanous Breathing and Patient connected to face mask oxygen  Post-op Assessment: Report given to RN and Post -op Vital signs reviewed and stable  Post vital signs: Reviewed and stable  Last Vitals:  Vitals:   10/28/20 1035 10/28/20 1323  BP: (!) 141/71 (!) 119/56  Pulse: 71 79  Resp: 16 17  Temp: 36.6 C 36.6 C  SpO2: 67% 519%    Complications: No apparent anesthesia complications

## 2020-10-28 NOTE — Discharge Instructions (Signed)
  Diet: Resume home heart healthy regular diet.   Activity: No heavy lifting >20 pounds (children, pets, laundry, garbage) or strenuous activity until follow-up, but light activity and walking are encouraged. Do not drive or drink alcohol if taking narcotic pain medications.  Wound care: May shower with soapy water and pat dry (do not rub incisions), but no baths or submerging incision underwater until follow-up. (no swimming)   Medications: Resume all home medications. For mild to moderate pain: acetaminophen (Tylenol) or ibuprofen (if no kidney disease). Combining Tylenol with alcohol can substantially increase your risk of causing liver disease. Narcotic pain medications, if prescribed, can be used for severe pain, though may cause nausea, constipation, and drowsiness. Do not combine Tylenol and Norco within a 6 hour period as Norco contains Tylenol. If you do not need the narcotic pain medication, you do not need to fill the prescription.  Call office ([phone]) at any time if any questions, worsening pain, fevers/chills, bleeding, drainage from incision site, or other concerns.   AMBULATORY SURGERY  DISCHARGE INSTRUCTIONS   1) The drugs that you were given will stay in your system until tomorrow so for the next 24 hours you should not:  A) Drive an automobile B) Make any legal decisions C) Drink any alcoholic beverage   2) You may resume regular meals tomorrow.  Today it is better to start with liquids and gradually work up to solid foods.  You may eat anything you prefer, but it is better to start with liquids, then soup and crackers, and gradually work up to solid foods.   3) Please notify your doctor immediately if you have any unusual bleeding, trouble breathing, redness and pain at the surgery site, drainage, fever, or pain not relieved by medication.    4) Additional Instructions:        Please contact your physician with any problems or Same Day Surgery at  [phone], Monday through Friday 6 am to 4 pm, or [facility] [place] Main number at [phone]. 

## 2020-10-28 NOTE — Op Note (Signed)
Preoperative diagnosis: Cholelithiasis  Postoperative diagnosis: Cholelithiasis  Procedure: Robotic Assisted Laparoscopic Cholecystectomy.   Anesthesia: GETA   Surgeon: Dr. Windell Moment  Wound Classification: Clean Contaminated  Indications: Patient is a 64 y.o. female developed right upper quadrant pain and on workup was found to have cholelithiasis with a normal common duct. Robotic Assisted Laparoscopic cholecystectomy was elected.  Findings: Critical view of safety achieved Cystic duct and artery identified, ligated and divided Adequate hemostasis  Description of procedure: The patient was placed on the operating table in the supine position. General anesthesia was induced. A time-out was completed verifying correct patient, procedure, site, positioning, and implant(s) and/or special equipment prior to beginning this procedure. An orogastric tube was placed. The abdomen was prepped and draped in the usual sterile fashion.  An incision was made in a natural skin line below the umbilicus.  The fascia was elevated and the Veress needle inserted. Proper position was confirmed by aspiration and saline meniscus test.  The abdomen was insufflated with carbon dioxide to a pressure of 15 mmHg. The patient tolerated insufflation well. A 8-mm trocar was then inserted in optiview fashion.  The laparoscope was inserted and the abdomen inspected. No injuries from initial trocar placement were noted. Additional trocars were then inserted in the following locations: an 8-mm trocar in the left lateral abdomen, and another two 8-mm trocars to the right side of the abdomen 5 cm appart. The umbilical trocar was changed to a 12 mm trocar all under direct visualization. The abdomen was inspected and no abnormalities were found. The table was placed in the reverse Trendelenburg position with the right side up. The robotic arms were docked and target anatomy identified. Instrument inserted under direct  visualization.  Filmy adhesions between the gallbladder and omentum, duodenum and transverse colon were lysed with electrocautery. The dome of the gallbladder was grasped with a prograsp and retracted over the dome of the liver. The infundibulum was also grasped with an atraumatic grasper and retracted toward the right lower quadrant. This maneuver exposed Calot's triangle. The peritoneum overlying the gallbladder infundibulum was then incised and the cystic duct and cystic artery identified and circumferentially dissected. Critical view of safety reviewed before ligating any structure. Firefly images taken to visualize biliary ducts. The cystic duct and cystic artery were then doubly clipped and divided close to the gallbladder.  The gallbladder was then dissected from its peritoneal attachments by electrocautery. Hemostasis was checked and the gallbladder and contained stones were removed using an endoscopic retrieval bag. The gallbladder was passed off the table as a specimen. There was no evidence of bleeding from the gallbladder fossa or cystic artery or leakage of the bile from the cystic duct stump. Secondary trocars were removed under direct vision. No bleeding was noted. The robotic arms were undoked. The scope was withdrawn and the umbilical trocar removed. The abdomen was allowed to collapse. The fascia of the 48mm trocar sites was closed with figure-of-eight 0 vicryl sutures. The skin was closed with subcuticular sutures of 4-0 monocryl and topical skin adhesive. The orogastric tube was removed.  The patient tolerated the procedure well and was taken to the postanesthesia care unit in stable condition.   Specimen: Gallbladder  Complications: None  EBL: 5 mL

## 2020-10-30 LAB — SURGICAL PATHOLOGY

## 2020-11-06 ENCOUNTER — Other Ambulatory Visit: Payer: Self-pay | Admitting: General Surgery

## 2020-11-06 ENCOUNTER — Ambulatory Visit
Admission: RE | Admit: 2020-11-06 | Discharge: 2020-11-06 | Disposition: A | Discharge status: Home / Self Care | Payer: BC Managed Care – PPO | Source: Ambulatory Visit | Attending: General Surgery | Admitting: General Surgery

## 2020-11-06 ENCOUNTER — Other Ambulatory Visit: Payer: Self-pay

## 2020-11-06 DIAGNOSIS — M7989 Other specified soft tissue disorders: Secondary | ICD-10-CM | POA: Insufficient documentation

## 2020-11-06 DIAGNOSIS — M79605 Pain in left leg: Secondary | ICD-10-CM | POA: Insufficient documentation

## 2020-11-25 ENCOUNTER — Telehealth: Payer: Self-pay

## 2020-11-25 NOTE — Telephone Encounter (Signed)
mychart message sent to patient re: no visitors 

## 2020-11-26 ENCOUNTER — Encounter: Payer: Self-pay | Admitting: Obstetrics and Gynecology

## 2020-11-26 ENCOUNTER — Ambulatory Visit: Payer: BC Managed Care – PPO | Admitting: Obstetrics and Gynecology

## 2020-11-26 ENCOUNTER — Telehealth: Payer: Self-pay

## 2020-11-26 ENCOUNTER — Other Ambulatory Visit: Payer: Self-pay

## 2020-11-26 VITALS — BP 135/84 | HR 76 | Ht 65.0 in | Wt 231.1 lb

## 2020-11-26 DIAGNOSIS — N3941 Urge incontinence: Secondary | ICD-10-CM | POA: Diagnosis not present

## 2020-11-26 MED ORDER — TOLTERODINE TARTRATE ER 4 MG PO CP24: 4.0000 mg | ORAL_CAPSULE | Freq: Every day | ORAL | 2 refills | Status: DC

## 2020-11-26 NOTE — Telephone Encounter (Signed)
Pt was checking out and wanted to make sure that we have her pharmacy as [facility]. Please advise

## 2020-11-26 NOTE — Telephone Encounter (Signed)
I have sent the new prescription to the new pharmacy. I have notified the patient.

## 2020-11-26 NOTE — Progress Notes (Signed)
HPI:      Gloria Wilkins is a 65 y.o. [id number] who LMP was Patient's last menstrual period was 07/26/2011 (approximate).  Subjective:   She presents today to follow-up for her Detrol use for urge incontinence.  She sees a significant difference and would like to continue on the Detrol.  She would like to increase her dose if possible. Of significant note patient recently had gallbladder surgery and is recovering from that at this time.    Hx: The following portions of the patient's history were reviewed and updated as appropriate:             She  has a past medical history of Abnormal uterine bleeding (AUB), Anxiety, Bacteremia due to Gram-positive bacteria, Complication of anesthesia, DM (diabetes mellitus) ([place]), Esophageal motility disorder, Gastric polyps, GERD (gastroesophageal reflux disease), HTN (hypertension), Hyperplastic colonic polyp, Hypertriglyceridemia, Hypometabolism, Increased BMI, Irregular Z line of esophagus, Obesity, Perimenopausal, PONV (postoperative nausea and vomiting), Tubular adenoma of colon, Unstable bladder (12/16/2014), and Vaginal Pap smear, abnormal. She does not have any pertinent problems on file. She  has a past surgical history that includes ablation; Cervical discectomy; LEEP; Tubal ligation; Knee surgery (Left); Colonoscopy; esophagitis; Colonoscopy (N/A, 07/25/2017); Back surgery; and CESAREAN. Her family history includes Breast cancer in her daughter, maternal aunt, and maternal aunt; Heart disease in her father. She  reports that she quit smoking about 20 years ago. She has never used smokeless tobacco. She reports that she does not drink alcohol and does not use drugs. She has a current medication list which includes the following prescription(s): acetaminophen, cetirizine, cholecalciferol, diclofenac, felodipine, fluticasone, losartan, metformin, naproxen sodium, nebivolol, pantoprazole, potassium, rosuvastatin, ozempic (0.25 or 0.5 mg/dose),  tolterodine, and vortioxetine hbr. She is allergic to vancomycin, sulfa antibiotics, and sulfasalazine.       Review of Systems:  Review of Systems  Constitutional: Denied constitutional symptoms, night sweats, recent illness, fatigue, fever, insomnia and weight loss.  Eyes: Denied eye symptoms, eye pain, photophobia, vision change and visual disturbance.  Ears/Nose/Throat/Neck: Denied ear, nose, throat or neck symptoms, hearing loss, nasal discharge, sinus congestion and sore throat.  Cardiovascular: Denied cardiovascular symptoms, arrhythmia, chest pain/pressure, edema, exercise intolerance, orthopnea and palpitations.  Respiratory: Denied pulmonary symptoms, asthma, pleuritic pain, productive sputum, cough, dyspnea and wheezing.  Gastrointestinal: Denied, gastro-esophageal reflux, melena, nausea and vomiting.  Genitourinary: Denied genitourinary symptoms including symptomatic vaginal discharge, pelvic relaxation issues, and urinary complaints.  Musculoskeletal: Denied musculoskeletal symptoms, stiffness, swelling, muscle weakness and myalgia.  Dermatologic: Denied dermatology symptoms, rash and scar.  Neurologic: Denied neurology symptoms, dizziness, headache, neck pain and syncope.  Psychiatric: Denied psychiatric symptoms, anxiety and depression.  Endocrine: Denied endocrine symptoms including hot flashes and night sweats.   Meds:   Current Outpatient Medications on File Prior to Visit  Medication Sig Dispense Refill  . acetaminophen (TYLENOL) 500 MG tablet Take 1,000 mg by mouth every 6 (six) hours as needed for moderate pain.    . cetirizine (ZYRTEC) 10 MG tablet Take 10 mg by mouth at bedtime.    . cholecalciferol (VITAMIN D3) 25 MCG (1000 UNIT) tablet Take 2,000 Units by mouth daily.    . diclofenac (VOLTAREN) 50 MG EC tablet Take 50 mg by mouth daily.     . felodipine (PLENDIL) 5 MG 24 hr tablet Take 5 mg by mouth daily.    . fluticasone (FLONASE) 50 MCG/ACT nasal spray Place 2  sprays into both nostrils daily as needed for allergies or rhinitis.    Marland Kitchen  losartan (COZAAR) 25 MG tablet Take 25 mg by mouth daily.     . metFORMIN (GLUCOPHAGE) 1000 MG tablet Take 1,000 mg by mouth daily.    . naproxen sodium (ALEVE) 220 MG tablet Take 440 mg by mouth daily as needed (pain).    . nebivolol (BYSTOLIC) 5 MG tablet Take 5 mg by mouth daily.     . pantoprazole (PROTONIX) 40 MG tablet Take 40 mg by mouth daily.     . Potassium 99 MG TABS Take 99 mg by mouth daily.     . rosuvastatin (CRESTOR) 10 MG tablet Take 10 mg by mouth at bedtime.    . Semaglutide,0.25 or 0.5MG /DOS, (OZEMPIC, 0.25 OR 0.5 MG/DOSE,) 2 MG/1.5ML SOPN Inject 0.5 mg as directed every Sunday.     . vortioxetine HBr (TRINTELLIX) 5 MG TABS tablet Take 5 mg by mouth at bedtime.     No current facility-administered medications on file prior to visit.          Objective:     Vitals:   11/26/20 1042  BP: 135/84  Pulse: 76   Filed Weights   11/26/20 1042  Weight: 231 lb 1.6 oz (104.8 kg)                Assessment:    [id number] Patient Active Problem List   Diagnosis Date Noted  . Psychosocial stressors 09/28/2017  . Status post tubal ligation 09/22/2016  . Status post endometrial ablation 09/22/2016  . Sepsis ([place]) 08/13/2016  . Menopause 05/28/2016  . Hot flashes, menopausal 05/28/2016  . Obesity, Class III, BMI 40-49.9 (morbid obesity) ([place]) 05/28/2016  . Anxiety state 12/03/2014  . Acid reflux 12/03/2014  . Essential (primary) hypertension 12/03/2014  . Hypometabolism 12/03/2014  . Pure hyperglyceridemia 12/03/2014     1. Urge incontinence     Patient noticed a significant improvement in her symptoms.  She would like to continue Detrol.   Plan:            1.  Patient will continue Detrol.  She would like to try the 4 mg dose to see if her symptoms continue to improve at that level. Orders No orders of the defined types were placed in this encounter.    Meds ordered this encounter   Medications  . tolterodine (DETROL LA) 4 MG 24 hr capsule    Sig: Take 1 capsule (4 mg total) by mouth daily.    Dispense:  90 capsule    Refill:  2      F/U  No follow-ups on file. I spent 21 minutes involved in the care of this patient preparing to see the patient by obtaining and reviewing her medical history (including labs, imaging tests and prior procedures), documenting clinical information in the electronic health record (EHR), counseling and coordinating care plans, writing and sending prescriptions, ordering tests or procedures and directly communicating with the patient by discussing pertinent items from her history and physical exam as well as detailing my assessment and plan as noted above so that she has an informed understanding.  All of her questions were answered.  Finis Bud, M.D. 11/26/2020 11:21 AM

## 2021-07-31 ENCOUNTER — Other Ambulatory Visit: Payer: Self-pay | Admitting: Obstetrics and Gynecology

## 2021-07-31 DIAGNOSIS — N3941 Urge incontinence: Secondary | ICD-10-CM

## 2021-08-10 DIAGNOSIS — S86919A Strain of unspecified muscle(s) and tendon(s) at lower leg level, unspecified leg, initial encounter: Secondary | ICD-10-CM | POA: Insufficient documentation

## 2021-08-10 DIAGNOSIS — M1711 Unilateral primary osteoarthritis, right knee: Secondary | ICD-10-CM | POA: Insufficient documentation

## 2021-08-10 HISTORY — DX: Strain of unspecified muscle(s) and tendon(s) at lower leg level, unspecified leg, initial encounter: S86.919A

## 2022-01-14 ENCOUNTER — Other Ambulatory Visit: Payer: Self-pay | Admitting: Nurse Practitioner

## 2022-01-14 DIAGNOSIS — Z1231 Encounter for screening mammogram for malignant neoplasm of breast: Secondary | ICD-10-CM

## 2022-02-23 ENCOUNTER — Ambulatory Visit
Admission: RE | Admit: 2022-02-23 | Discharge: 2022-02-23 | Disposition: A | Discharge status: Home / Self Care | Payer: BC Managed Care – PPO | Source: Ambulatory Visit | Attending: Nurse Practitioner | Admitting: Nurse Practitioner

## 2022-02-23 DIAGNOSIS — Z1231 Encounter for screening mammogram for malignant neoplasm of breast: Secondary | ICD-10-CM | POA: Insufficient documentation

## 2022-11-03 IMAGING — MG MM DIGITAL SCREENING BILAT W/ TOMO AND CAD
6 of 10 series · 6 of 30 positions shown · non-contrast
Comparison: Previous exam(s).

CLINICAL DATA: Screening.

EXAM:
DIGITAL SCREENING BILATERAL MAMMOGRAM WITH TOMOSYNTHESIS AND CAD
TECHNIQUE: Bilateral screening digital craniocaudal and mediolateral oblique
mammograms were obtained. Bilateral screening digital breast
tomosynthesis was performed. The images were evaluated with
computer-aided detection.

[R CV synth-2D]
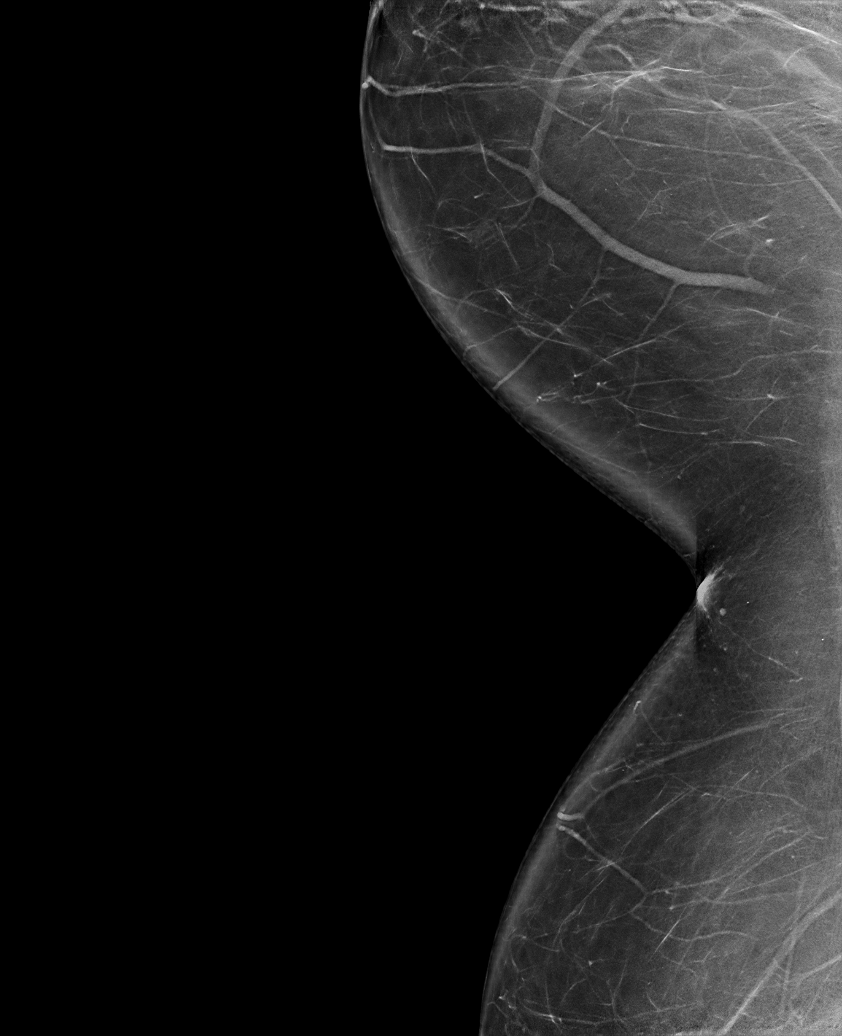

[L MLO synth-2D]
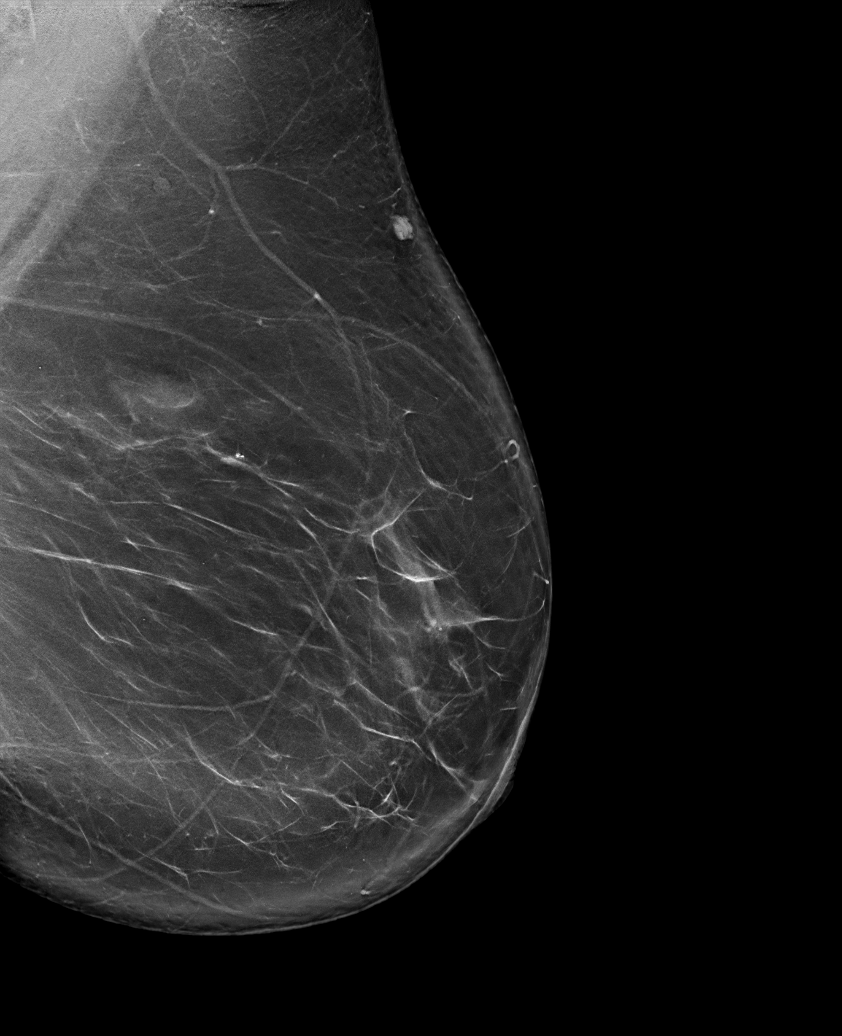

[L CC synth-2D]
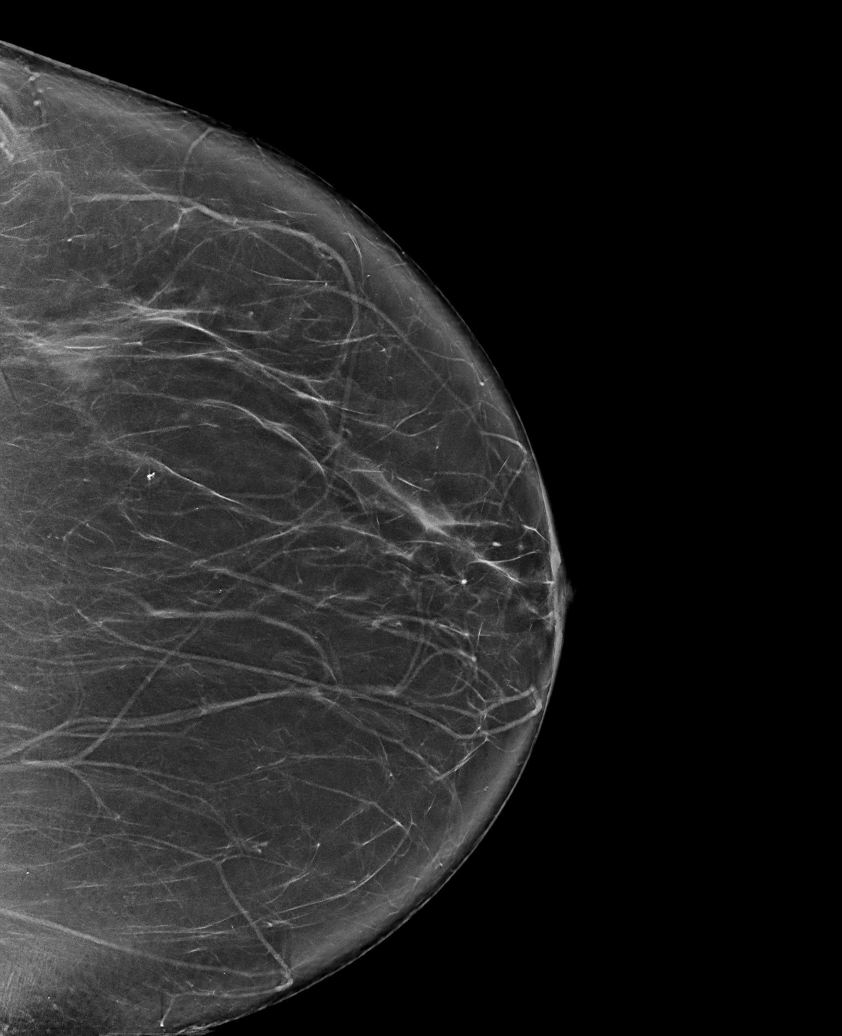

[R MLO synth-2D]
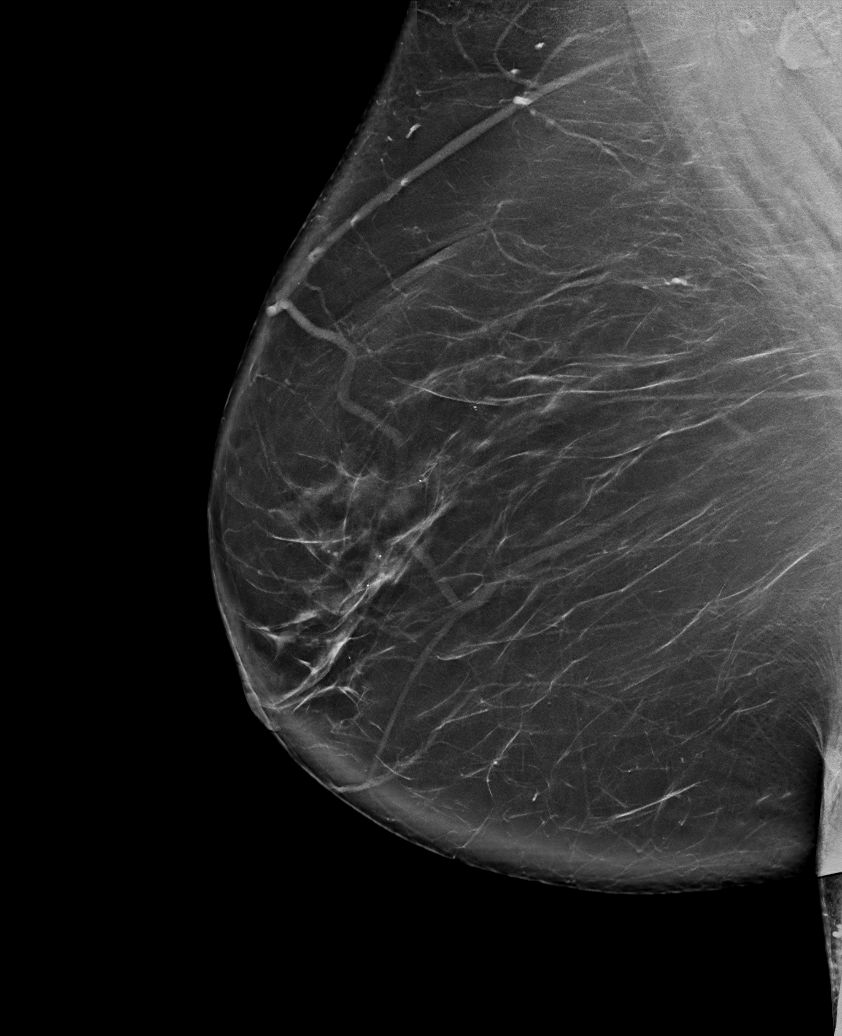

[R CC synth-2D]
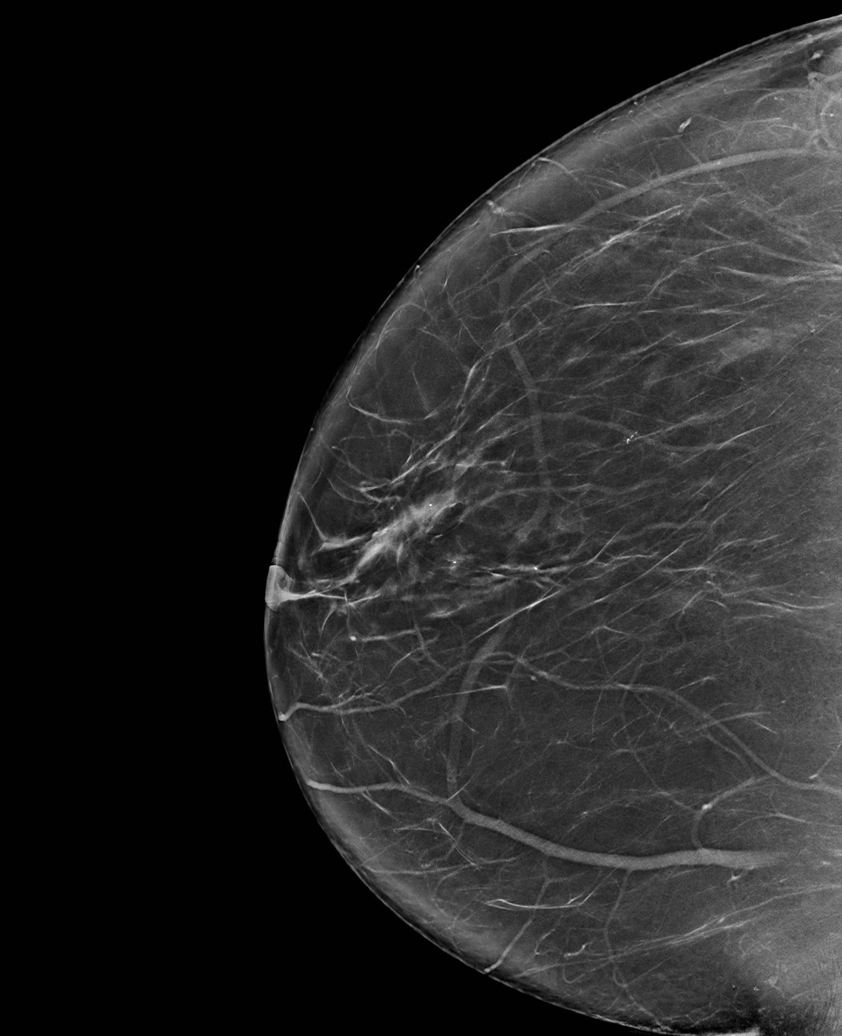

[L MLO tomo · tomo slice 50/99.0]
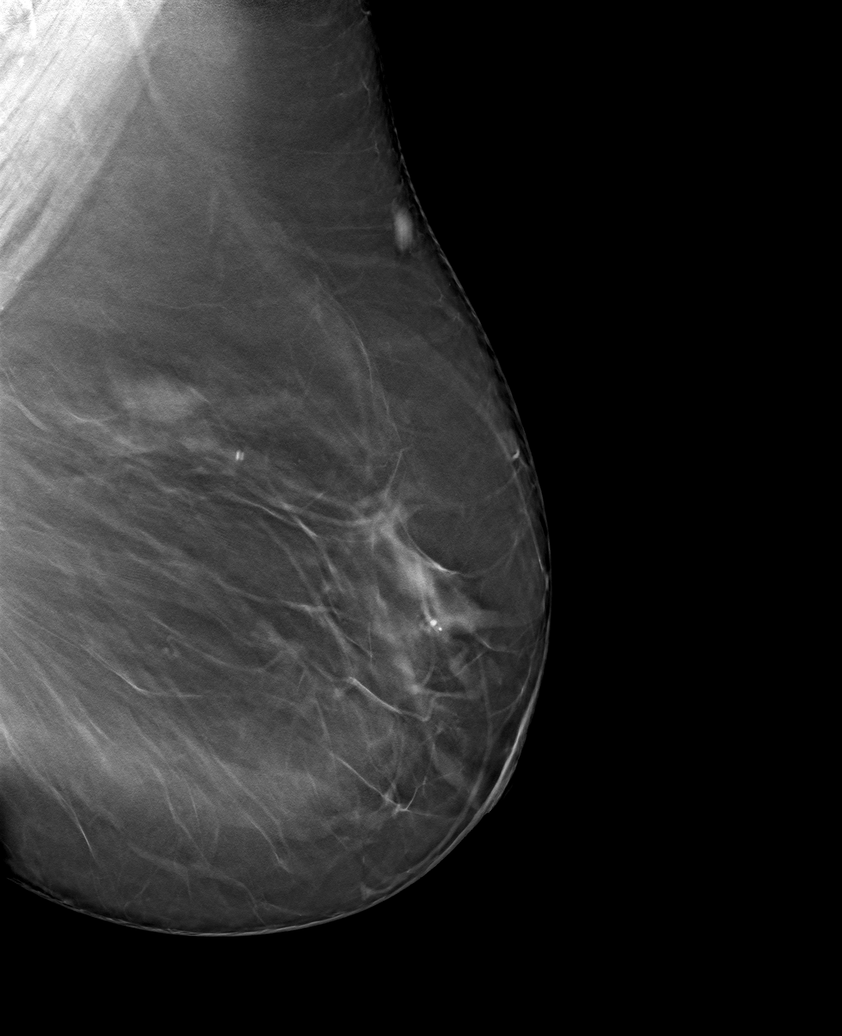

[6 of 30 positions shown; findings below may reference images not displayed]

ACR Breast Density Category b: There are scattered areas of
fibroglandular density.
FINDINGS: There are no findings suspicious for malignancy.
IMPRESSION: No mammographic evidence of malignancy. A result letter of this
screening mammogram will be mailed directly to the patient.

RECOMMENDATION:
Screening mammogram in one year. (Code:51-O-LD2)

BI-RADS CATEGORY  1: Negative.

## 2023-01-04 ENCOUNTER — Other Ambulatory Visit: Payer: Self-pay | Admitting: Nurse Practitioner

## 2023-01-05 LAB — COMPREHENSIVE METABOLIC PANEL
ALT: 22 IU/L (ref 0–32)
AST: 21 IU/L (ref 0–40)
Albumin/Globulin Ratio: 1.6 (ref 1.2–2.2)
Albumin: 4.2 g/dL (ref 3.9–4.9)
Alkaline Phosphatase: 125 IU/L — ABNORMAL HIGH (ref 44–121)
BUN/Creatinine Ratio: 28 (ref 12–28)
BUN: 18 mg/dL (ref 8–27)
Bilirubin Total: 0.4 mg/dL (ref 0.0–1.2)
CO2: 25 mmol/L (ref 20–29)
Calcium: 9.1 mg/dL (ref 8.7–10.3)
Chloride: 99 mmol/L (ref 96–106)
Creatinine, Ser: 0.65 mg/dL (ref 0.57–1.00)
Globulin, Total: 2.7 g/dL (ref 1.5–4.5)
Glucose: 203 mg/dL — ABNORMAL HIGH (ref 70–99)
Potassium: 4.6 mmol/L (ref 3.5–5.2)
Sodium: 138 mmol/L (ref 134–144)
Total Protein: 6.9 g/dL (ref 6.0–8.5)
eGFR: 98 mL/min/{1.73_m2} (ref >59)

## 2023-01-05 LAB — LIPID PANEL W/O CHOL/HDL RATIO
Cholesterol, Total: 122 mg/dL (ref 100–199)
HDL: 28 mg/dL — ABNORMAL LOW (ref >39)
LDL Chol Calc (NIH): 55 mg/dL (ref 0–99)
Triglycerides: 247 mg/dL — ABNORMAL HIGH (ref 0–149)
VLDL Cholesterol Cal: 39 mg/dL (ref 5–40)

## 2023-01-05 LAB — TSH: TSH: 3.56 u[IU]/mL (ref 0.450–4.500)

## 2023-01-05 LAB — HGB A1C W/O EAG: Hgb A1c MFr Bld: 8.8 % — ABNORMAL HIGH (ref 4.8–5.6)

## 2023-01-20 ENCOUNTER — Other Ambulatory Visit: Payer: Self-pay

## 2023-01-20 ENCOUNTER — Ambulatory Visit (INDEPENDENT_AMBULATORY_CARE_PROVIDER_SITE_OTHER): Payer: BC Managed Care – PPO | Admitting: Nurse Practitioner

## 2023-01-20 VITALS — BP 132/80 | HR 69

## 2023-01-20 DIAGNOSIS — E1100 Type 2 diabetes mellitus with hyperosmolarity without nonketotic hyperglycemic-hyperosmolar coma (NKHHC): Secondary | ICD-10-CM | POA: Diagnosis not present

## 2023-01-20 DIAGNOSIS — N3941 Urge incontinence: Secondary | ICD-10-CM

## 2023-01-20 DIAGNOSIS — F32A Depression, unspecified: Secondary | ICD-10-CM | POA: Diagnosis not present

## 2023-01-20 DIAGNOSIS — I10 Essential (primary) hypertension: Secondary | ICD-10-CM | POA: Diagnosis not present

## 2023-01-20 DIAGNOSIS — E1165 Type 2 diabetes mellitus with hyperglycemia: Secondary | ICD-10-CM

## 2023-01-20 MED ORDER — OZEMPIC (0.25 OR 0.5 MG/DOSE) 2 MG/1.5ML ~~LOC~~ SOPN: 0.5000 mg | PEN_INJECTOR | SUBCUTANEOUS | 3 refills | Status: DC

## 2023-01-20 MED ORDER — ROSUVASTATIN CALCIUM 10 MG PO TABS: 10.0000 mg | ORAL_TABLET | Freq: Every day | ORAL | 3 refills | Status: DC

## 2023-01-20 MED ORDER — TOLTERODINE TARTRATE ER 4 MG PO CP24: 4.0000 mg | ORAL_CAPSULE | Freq: Every day | ORAL | 3 refills | Status: DC

## 2023-01-20 MED ORDER — LOSARTAN POTASSIUM 25 MG PO TABS: 25.0000 mg | ORAL_TABLET | Freq: Every day | ORAL | 3 refills | Status: DC

## 2023-01-20 MED ORDER — DICLOFENAC SODIUM 50 MG PO TBEC: 50.0000 mg | DELAYED_RELEASE_TABLET | Freq: Two times a day (BID) | ORAL | 3 refills | Status: DC

## 2023-01-20 MED ORDER — VORTIOXETINE HBR 5 MG PO TABS: 5.0000 mg | ORAL_TABLET | Freq: Every day | ORAL | 3 refills | Status: DC

## 2023-01-20 MED ORDER — NEBIVOLOL HCL 5 MG PO TABS: 5.0000 mg | ORAL_TABLET | Freq: Every day | ORAL | 3 refills | Status: DC

## 2023-01-20 NOTE — Progress Notes (Signed)
Established Patient Office Visit  Subjective:  Patient ID: Gloria Wilkins, female    DOB: 08-08-1956  Age: 67 y.o. MRN: [id number]  Chief Complaint  Patient presents with   Follow-up    Medication Follow Up    Follow up appt for fasting labs, A1c at 8.8%, LDL 55 but trigs > 250.  Ozempic started 0.5 mg last week, will consider going up higher in 2-4 weeks.  No nausea, no abd pain.       Past Medical History:  Diagnosis Date   Abnormal uterine bleeding (AUB)    Anxiety    Bacteremia due to Gram-positive bacteria    Complication of anesthesia    DM (diabetes mellitus) (HCC)    Esophageal motility disorder    Gastric polyps    GERD (gastroesophageal reflux disease)    HTN (hypertension)    Hyperplastic colonic polyp    Hypertriglyceridemia    Hypometabolism    Increased BMI    Irregular Z line of esophagus    Obesity    Perimenopausal    PONV (postoperative nausea and vomiting)    Tubular adenoma of colon    x 8   Unstable bladder 12/16/2014   Vaginal Pap smear, abnormal     Past Surgical History:  Procedure Laterality Date   ablation     endometrial   BACK SURGERY     L5-S1 DISCECTOMY   CERVICAL DISCECTOMY     CESAREAN     COLONOSCOPY     COLONOSCOPY N/A 07/25/2017   Procedure: COLONOSCOPY;  Surgeon: Lollie Sails, MD;  Location: [facility] ENDOSCOPY;  Service: Endoscopy;  Laterality: N/A;   esophagitis     KNEE SURGERY Left    LEEP     TUBAL LIGATION      Social History   Socioeconomic History   Marital status: Widowed    Spouse name: Not on file   Number of children: Not on file   Years of education: Not on file   Highest education level: Not on file  Occupational History   Not on file  Tobacco Use   Smoking status: Former    Types: Cigarettes    Quit date: 11/15/2000    Years since quitting: 22.1   Smokeless tobacco: Never  Vaping Use   Vaping Use: Never used  Substance and Sexual Activity   Alcohol use: No   Drug use: No   Sexual  activity: Yes    Birth control/protection: Surgical  Other Topics Concern   Not on file  Social History Narrative   Not on file   Social Determinants of Health   Financial Resource Strain: Not on file  Food Insecurity: Not on file  Transportation Needs: Not on file  Physical Activity: Inactive (10/04/2018)   Exercise Vital Sign    Days of Exercise per Week: 0 days    Minutes of Exercise per Session: 0 min  Stress: Not on file  Social Connections: Not on file  Intimate Partner Violence: Not At Risk (10/04/2018)   Humiliation, Afraid, Rape, and Kick questionnaire    Fear of Current or Ex-Partner: No    Emotionally Abused: No    Physically Abused: No    Sexually Abused: No    Family History  Problem Relation Age of Onset   Heart disease Father    Breast cancer Maternal Aunt    Breast cancer Daughter    Breast cancer Maternal Aunt    Diabetes Neg Hx    Colon cancer  Neg Hx    Ovarian cancer Neg Hx     Allergies  Allergen Reactions   Vancomycin Hives   Sulfa Antibiotics Rash   Sulfasalazine Rash    Review of Systems  Constitutional:  Positive for malaise/fatigue.  HENT: Negative.    Eyes: Negative.   Respiratory:  Positive for sputum production.   Cardiovascular: Negative.   Gastrointestinal:  Positive for constipation.  Genitourinary:  Positive for urgency.  Musculoskeletal:  Positive for joint pain.  Skin: Negative.   Neurological: Negative.   Endo/Heme/Allergies:  Bruises/bleeds easily.  Psychiatric/Behavioral:  Positive for depression.        Objective:   BP 132/80   Pulse 69   LMP 07/26/2011 (Approximate)   SpO2 96%   Vitals:   01/20/23 1358  BP: 132/80  Pulse: 69  SpO2: 96%    Physical Exam Vitals reviewed.  Constitutional:      Appearance: Normal appearance.  HENT:     Head: Normocephalic.     Nose: Nose normal.     Mouth/Throat:     Mouth: Mucous membranes are moist.  Eyes:     Pupils: Pupils are equal, round, and reactive to  light.  Cardiovascular:     Rate and Rhythm: Normal rate and regular rhythm.  Pulmonary:     Effort: Pulmonary effort is normal.     Breath sounds: Normal breath sounds.  Abdominal:     General: Bowel sounds are normal.     Palpations: Abdomen is soft.  Musculoskeletal:        General: Swelling and tenderness present.     Cervical back: Normal range of motion and neck supple.  Skin:    General: Skin is warm and dry.  Neurological:     Mental Status: She is alert and oriented to person, place, and time.  Psychiatric:        Mood and Affect: Mood normal.        Behavior: Behavior normal.      No results found for any visits on 01/20/23.  Recent Results (from the past 2160 hour(s))  Comprehensive metabolic panel     Status: Abnormal   Collection Time: 01/04/23  8:34 AM  Result Value Ref Range   Glucose 203 (H) 70 - 99 mg/dL   BUN 18 8 - 27 mg/dL   Creatinine, Ser 0.65 0.57 - 1.00 mg/dL   eGFR 98 >59 mL/min/1.73   BUN/Creatinine Ratio 28 12 - 28   Sodium 138 134 - 144 mmol/L   Potassium 4.6 3.5 - 5.2 mmol/L   Chloride 99 96 - 106 mmol/L   CO2 25 20 - 29 mmol/L   Calcium 9.1 8.7 - 10.3 mg/dL   Total Protein 6.9 6.0 - 8.5 g/dL   Albumin 4.2 3.9 - 4.9 g/dL   Globulin, Total 2.7 1.5 - 4.5 g/dL   Albumin/Globulin Ratio 1.6 1.2 - 2.2   Bilirubin Total 0.4 0.0 - 1.2 mg/dL   Alkaline Phosphatase 125 (H) 44 - 121 IU/L   AST 21 0 - 40 IU/L   ALT 22 0 - 32 IU/L  Lipid Panel w/o Chol/HDL Ratio     Status: Abnormal   Collection Time: 01/04/23  8:34 AM  Result Value Ref Range   Cholesterol, Total 122 100 - 199 mg/dL   Triglycerides 247 (H) 0 - 149 mg/dL   HDL 28 (L) >39 mg/dL   VLDL Cholesterol Cal 39 5 - 40 mg/dL   LDL Chol Calc (NIH) 55 0 - 99  mg/dL  Hgb A1c w/o eAG     Status: Abnormal   Collection Time: 01/04/23  8:34 AM  Result Value Ref Range   Hgb A1c MFr Bld 8.8 (H) 4.8 - 5.6 %    Comment:          Prediabetes: 5.7 - 6.4          Diabetes: >6.4          Glycemic  control for adults with diabetes: <7.0   TSH     Status: None   Collection Time: 01/04/23  8:34 AM  Result Value Ref Range   TSH 3.560 0.450 - 4.500 uIU/mL      Assessment & Plan:   Problem List Items Addressed This Visit       Cardiovascular and Mediastinum   Essential (primary) hypertension - Primary     Endocrine   Type II diabetes mellitus with hyperosmolarity, uncontrolled (HCC)     Other   Obesity, Class III, BMI 40-49.9 (morbid obesity) ([place])   Depression    Return in about 3 months (around 04/22/2023).   Total time spent: 20 minutes  Evern Bio, NP  01/20/2023

## 2023-01-21 ENCOUNTER — Other Ambulatory Visit: Payer: Self-pay

## 2023-01-21 DIAGNOSIS — E1100 Type 2 diabetes mellitus with hyperosmolarity without nonketotic hyperglycemic-hyperosmolar coma (NKHHC): Secondary | ICD-10-CM | POA: Insufficient documentation

## 2023-01-21 DIAGNOSIS — F32A Depression, unspecified: Secondary | ICD-10-CM | POA: Insufficient documentation

## 2023-01-21 DIAGNOSIS — N3941 Urge incontinence: Secondary | ICD-10-CM

## 2023-01-21 MED ORDER — DICLOFENAC SODIUM 50 MG PO TBEC: 50.0000 mg | DELAYED_RELEASE_TABLET | Freq: Two times a day (BID) | ORAL | 3 refills | Status: DC

## 2023-01-21 MED ORDER — ROSUVASTATIN CALCIUM 10 MG PO TABS: 10.0000 mg | ORAL_TABLET | Freq: Every day | ORAL | 3 refills | Status: DC

## 2023-01-21 MED ORDER — TOLTERODINE TARTRATE ER 4 MG PO CP24: 4.0000 mg | ORAL_CAPSULE | Freq: Every day | ORAL | 3 refills | Status: AC

## 2023-01-21 MED ORDER — VORTIOXETINE HBR 5 MG PO TABS: 5.0000 mg | ORAL_TABLET | Freq: Every day | ORAL | 3 refills | Status: DC

## 2023-01-21 MED ORDER — LOSARTAN POTASSIUM 25 MG PO TABS: 25.0000 mg | ORAL_TABLET | Freq: Every day | ORAL | 3 refills | Status: DC

## 2023-01-21 MED ORDER — NEBIVOLOL HCL 5 MG PO TABS: 5.0000 mg | ORAL_TABLET | Freq: Every day | ORAL | 3 refills | Status: DC

## 2023-01-21 MED ORDER — OZEMPIC (0.25 OR 0.5 MG/DOSE) 2 MG/1.5ML ~~LOC~~ SOPN: 0.5000 mg | PEN_INJECTOR | SUBCUTANEOUS | 3 refills | Status: DC

## 2023-01-21 NOTE — Patient Instructions (Signed)
1) Stricter dietary control for DMII and triglycerides 2) Restarted Ozempic 3) Follow up appt in 3 months, 2 weeks, fasting labs prior

## 2023-03-28 ENCOUNTER — Other Ambulatory Visit (INDEPENDENT_AMBULATORY_CARE_PROVIDER_SITE_OTHER): Payer: BC Managed Care – PPO | Admitting: Nurse Practitioner

## 2023-03-28 ENCOUNTER — Encounter: Payer: Self-pay | Admitting: Nurse Practitioner

## 2023-03-28 DIAGNOSIS — R8290 Unspecified abnormal findings in urine: Secondary | ICD-10-CM | POA: Diagnosis not present

## 2023-03-28 LAB — POCT URINALYSIS DIPSTICK
Bilirubin, UA: NEGATIVE
Blood, UA: NEGATIVE
Glucose, UA: NEGATIVE
Ketones, UA: NEGATIVE
Leukocytes, UA: NEGATIVE
Nitrite, UA: NEGATIVE
Protein, UA: POSITIVE — AB
Spec Grav, UA: >=1.03 — AB (ref 1.010–1.025)
Urobilinogen, UA: 0.2 E.U./dL
pH, UA: 6 (ref 5.0–8.0)

## 2023-06-28 ENCOUNTER — Other Ambulatory Visit: Payer: Medicare Other

## 2023-06-28 ENCOUNTER — Other Ambulatory Visit: Payer: Self-pay

## 2023-06-28 DIAGNOSIS — E1100 Type 2 diabetes mellitus with hyperosmolarity without nonketotic hyperglycemic-hyperosmolar coma (NKHHC): Secondary | ICD-10-CM

## 2023-06-28 DIAGNOSIS — I10 Essential (primary) hypertension: Secondary | ICD-10-CM

## 2023-07-05 ENCOUNTER — Ambulatory Visit (INDEPENDENT_AMBULATORY_CARE_PROVIDER_SITE_OTHER): Payer: Medicare Other | Admitting: Family

## 2023-07-05 VITALS — BP 130/90 | HR 78 | Ht 64.0 in | Wt 247.2 lb

## 2023-07-05 DIAGNOSIS — I10 Essential (primary) hypertension: Secondary | ICD-10-CM

## 2023-07-05 DIAGNOSIS — E781 Pure hyperglyceridemia: Secondary | ICD-10-CM

## 2023-07-05 DIAGNOSIS — E1100 Type 2 diabetes mellitus with hyperosmolarity without nonketotic hyperglycemic-hyperosmolar coma (NKHHC): Secondary | ICD-10-CM

## 2023-07-05 DIAGNOSIS — Z0000 Encounter for general adult medical examination without abnormal findings: Secondary | ICD-10-CM

## 2023-07-05 LAB — POCT CBG (FASTING - GLUCOSE)-MANUAL ENTRY: Glucose Fasting, POC: 146 mg/dL — AB (ref 70–99)

## 2023-07-05 MED ORDER — PANTOPRAZOLE SODIUM 40 MG PO TBEC: 40.0000 mg | DELAYED_RELEASE_TABLET | Freq: Every day | ORAL | 1 refills | Status: DC

## 2023-07-05 MED ORDER — MOUNJARO 2.5 MG/0.5ML ~~LOC~~ SOAJ: 2.5000 mg | SUBCUTANEOUS | 0 refills | Status: DC

## 2023-07-05 MED ORDER — MOUNJARO 5 MG/0.5ML ~~LOC~~ SOAJ: 5.0000 mg | SUBCUTANEOUS | 3 refills | Status: DC

## 2023-07-23 ENCOUNTER — Encounter: Payer: Self-pay | Admitting: Family

## 2023-07-23 NOTE — Assessment & Plan Note (Addendum)
Adding Mounjaro to regimen.   Continue current other diabetes meds, as patient has been well controlled on current regimen.  Will adjust meds if needed based on labs.

## 2023-07-23 NOTE — Progress Notes (Signed)
Annual Wellness Visit  Patient: Gloria Wilkins, Female    DOB: 1956/10/02, 67 y.o.   MRN: [id number] Visit Date: 07/23/2023  Today's Provider: Miki Kins, FNP  Subjective:    Chief Complaint  Patient presents with   Annual Exam   Follow-up   Gloria Wilkins is a 67 y.o. female who presents today for her Annual Wellness Visit.  This is her initial annual wellness visit, as she just got Medicare last month.  She is generally feeling okay physically, though she does report some significant fatigue and low mood.  She is still grieving.   Recently had her labs done, see additional note for this discussion.      Past Medical History:  Diagnosis Date   Abnormal uterine bleeding (AUB)    Anxiety    Bacteremia due to Gram-positive bacteria    Complication of anesthesia    DM (diabetes mellitus) (HCC)    Esophageal motility disorder    Gastric polyps    GERD (gastroesophageal reflux disease)    HTN (hypertension)    Hyperplastic colonic polyp    Hypertriglyceridemia    Hypometabolism    Increased BMI    Irregular Z line of esophagus    Obesity    Perimenopausal    PONV (postoperative nausea and vomiting)    Status post endometrial ablation 09/22/2016   Status post tubal ligation 09/22/2016   Strain of knee 08/10/2021   Tubular adenoma of colon    x 8   Unstable bladder 12/16/2014   Vaginal Pap smear, abnormal    Past Surgical History:  Procedure Laterality Date   ablation     endometrial   BACK SURGERY     L5-S1 DISCECTOMY   CERVICAL DISCECTOMY     CESAREAN     COLONOSCOPY     COLONOSCOPY N/A 07/25/2017   Procedure: COLONOSCOPY;  Surgeon: Christena Deem, MD;  Location: [facility] ENDOSCOPY;  Service: Endoscopy;  Laterality: N/A;   esophagitis     KNEE SURGERY Left    LEEP     TUBAL LIGATION     Family History  Problem Relation Age of Onset   Heart disease Father    Breast cancer Maternal Aunt    Breast cancer Daughter    Breast cancer Maternal  Aunt    Diabetes Neg Hx    Colon cancer Neg Hx    Ovarian cancer Neg Hx    Social History   Socioeconomic History   Marital status: Widowed    Spouse name: Trey Paula   Number of children: 2   Years of education: Not on file   Highest education level: Not on file  Occupational History   Occupation: Radiology Tech    Employer: [employer]  Tobacco Use   Smoking status: Former    Current packs/day: 0.00    Types: Cigarettes    Quit date: 11/15/2000    Years since quitting: 22.6   Smokeless tobacco: Never  Vaping Use   Vaping status: Never Used  Substance and Sexual Activity   Alcohol use: No   Drug use: No   Sexual activity: Yes    Birth control/protection: Surgical  Other Topics Concern   Not on file  Social History Narrative   Not on file   Social Determinants of Health   Financial Resource Strain: Not on file  Food Insecurity: Not on file  Transportation Needs: Not on file  Physical Activity: Inactive (10/04/2018)   Exercise Vital Sign  Days of Exercise per Week: 0 days    Minutes of Exercise per Session: 0 min  Stress: Not on file  Social Connections: Not on file  Intimate Partner Violence: Not At Risk (10/04/2018)   Humiliation, Afraid, Rape, and Kick questionnaire    Fear of Current or Ex-Partner: No    Emotionally Abused: No    Physically Abused: No    Sexually Abused: No    Medications: Outpatient Medications Prior to Visit  Medication Sig   acetaminophen (TYLENOL) 500 MG tablet Take 1,000 mg by mouth every 6 (six) hours as needed for moderate pain. (Patient not taking: Reported on 01/20/2023)   cetirizine (ZYRTEC) 10 MG tablet Take 10 mg by mouth at bedtime.   cholecalciferol (VITAMIN D3) 25 MCG (1000 UNIT) tablet Take 2,000 Units by mouth daily.   diclofenac (VOLTAREN) 50 MG EC tablet Take 1 tablet (50 mg total) by mouth 2 (two) times daily.   felodipine (PLENDIL) 5 MG 24 hr tablet Take 5 mg by mouth daily. (Patient not taking: Reported on  01/20/2023)   fluticasone (FLONASE) 50 MCG/ACT nasal spray Place 2 sprays into both nostrils daily as needed for allergies or rhinitis. (Patient not taking: Reported on 01/20/2023)   losartan (COZAAR) 25 MG tablet Take 1 tablet (25 mg total) by mouth daily.   naproxen sodium (ALEVE) 220 MG tablet Take 440 mg by mouth daily as needed (pain).   nebivolol (BYSTOLIC) 5 MG tablet Take 1 tablet (5 mg total) by mouth daily.   Potassium 99 MG TABS Take 99 mg by mouth daily.    rosuvastatin (CRESTOR) 10 MG tablet Take 1 tablet (10 mg total) by mouth at bedtime.   tolterodine (DETROL LA) 4 MG 24 hr capsule Take 1 capsule (4 mg total) by mouth daily.   vortioxetine HBr (TRINTELLIX) 5 MG TABS tablet Take 1 tablet (5 mg total) by mouth at bedtime.   [DISCONTINUED] metFORMIN (GLUCOPHAGE) 1000 MG tablet Take 1,000 mg by mouth daily. (Patient not taking: Reported on 01/20/2023)   [DISCONTINUED] pantoprazole (PROTONIX) 40 MG tablet Take 40 mg by mouth daily.    [DISCONTINUED] Semaglutide,0.25 or 0.5MG /DOS, (OZEMPIC, 0.25 OR 0.5 MG/DOSE,) 2 MG/1.5ML SOPN Inject 0.5 mg as directed every Sunday.   No facility-administered medications prior to visit.    Allergies  Allergen Reactions   Vancomycin Hives   Sulfa Antibiotics Rash   Sulfasalazine Rash    Patient Care Team: Miki Kins, FNP as PCP - General (Family Medicine)  Review of Systems  All other systems reviewed and are negative.    Objective:    Vitals: BP (!) 130/90   Pulse 78   Ht 5\' 4"  (1.626 m)   Wt 247 lb 3.2 oz (112.1 kg)   LMP 07/26/2011 (Approximate)   SpO2 96%   BMI 42.43 kg/m    Physical Exam Vitals and nursing note reviewed.  Constitutional:      Appearance: Normal appearance. She is obese.  HENT:     Head: Normocephalic.  Eyes:     Extraocular Movements: Extraocular movements intact.     Conjunctiva/sclera: Conjunctivae normal.     Pupils: Pupils are equal, round, and reactive to light.  Cardiovascular:     Rate and  Rhythm: Normal rate.  Pulmonary:     Effort: Pulmonary effort is normal.  Neurological:     General: No focal deficit present.     Mental Status: She is alert and oriented to person, place, and time. Mental status is at baseline.  Psychiatric:        Behavior: Behavior normal.        Thought Content: Thought content normal.        Judgment: Judgment normal.      Most recent functional status assessment:    07/23/2023    2:47 PM  In your present state of health, do you have any difficulty performing the following activities:  Hearing? 0  Vision? 0  Difficulty concentrating or making decisions? 0  Walking or climbing stairs? 1  Dressing or bathing? 0  Doing errands, shopping? 0    Most recent cognitive screening:    07/23/2023    1:41 PM  6CIT Screen  What Year? 0 points  What month? 0 points  What time? 0 points  Count back from 20 0 points  Months in reverse 0 points  Repeat phrase 0 points  Total Score 0 points    Results for orders placed or performed in visit on 07/05/23  POCT CBG (Fasting - Glucose)  Result Value Ref Range   Glucose Fasting, POC 146 (A) 70 - 99 mg/dL       Assessment & Plan:      Annual wellness visit done today including the all of the following: Reviewed patient's Family Medical History Reviewed and updated list of patient's medical providers Assessment of cognitive impairment was done Assessed patient's functional ability Established a written schedule for health screening services Health Risk Assessent Completed and Reviewed  Exercise Activities and Dietary recommendations  Goals      Activity and Exercise Increased     Evidence-based guidance:  Review current exercise levels.  Assess patient perspective on exercise or activity level, barriers to increasing activity, motivation and readiness for change.  Recommend or set healthy exercise goal based on individual tolerance.  Encourage small steps toward making change in amount of  exercise or activity.  Urge reduction of sedentary activities or screen time.  Promote group activities within the community or with family or support person.  Consider referral to rehabiliation therapist for assessment and exercise/activity plan.   Notes:      CCM:  Monitor and Maintain HbA1c <8%     Lab Results  Component Value Date   HGBA1C 9.8 (H) 06/28/2023   HGBA1C 8.8 (H) 01/04/2023        Weight (lb) < 200 lb (90.7 kg)        Immunization History  Administered Date(s) Administered   Influenza Inj Mdck Quad Pf 07/21/2018, 09/14/2019    Health Maintenance  Topic Date Due   FOOT EXAM  Never done   OPHTHALMOLOGY EXAM  Never done   HIV Screening  Never done   Diabetic kidney evaluation - Urine ACR  Never done   Hepatitis C Screening  Never done   DTaP/Tdap/Td (1 - Tdap) Never done   Zoster Vaccines- Shingrix (1 of 2) Never done   Pneumonia Vaccine 65+ Years old (1 of 1 - PCV) Never done   DEXA SCAN  Never done   COVID-19 Vaccine (1 - 2023-24 season) Never done   INFLUENZA VACCINE  09/15/2023 (Originally 06/16/2023)   PAP SMEAR-Modifier  10/15/2023   HEMOGLOBIN A1C  12/29/2023   MAMMOGRAM  02/24/2024   Diabetic kidney evaluation - eGFR measurement  06/27/2024   Medicare Annual Wellness (AWV)  07/04/2024   Colonoscopy  07/26/2027   HPV VACCINES  Aged Out     Discussed health benefits of physical activity, and encouraged her to engage in regular exercise appropriate  for her age and condition.        Miki Kins, FNP   07/05/2023

## 2023-07-23 NOTE — Assessment & Plan Note (Signed)
Continue current therapy for lipid control. Will modify as needed based on labwork results.   

## 2023-07-23 NOTE — Assessment & Plan Note (Signed)
Blood pressure well controlled with current medications.  Continue current therapy.  Will reassess at follow up.  

## 2023-07-23 NOTE — Progress Notes (Signed)
Established Patient Office Visit  Subjective:  Patient ID: Gloria Wilkins, female    DOB: 1956-09-15  Age: 67 y.o. MRN: [id number]  Chief Complaint  Patient presents with   Annual Exam   Follow-up    Patient is here today for her 6 months follow up.  She has been feeling fairly well since last appointment.   She does not have additional concerns to discuss today.  Labs were done recently, so we will review these today. She needs refills.   I have reviewed her active problem list, medication list, allergies, social history, notes from last encounter, lab results for her appointment today.      No other concerns at this time.   Past Medical History:  Diagnosis Date   Abnormal uterine bleeding (AUB)    Anxiety    Bacteremia due to Gram-positive bacteria    Complication of anesthesia    DM (diabetes mellitus) (HCC)    Esophageal motility disorder    Gastric polyps    GERD (gastroesophageal reflux disease)    HTN (hypertension)    Hyperplastic colonic polyp    Hypertriglyceridemia    Hypometabolism    Increased BMI    Irregular Z line of esophagus    Obesity    Perimenopausal    PONV (postoperative nausea and vomiting)    Sepsis (HCC) 08/13/2016   Status post endometrial ablation 09/22/2016   Status post tubal ligation 09/22/2016   Strain of knee 08/10/2021   Tubular adenoma of colon    x 8   Unstable bladder 12/16/2014   Vaginal Pap smear, abnormal     Past Surgical History:  Procedure Laterality Date   ablation     endometrial   BACK SURGERY     L5-S1 DISCECTOMY   CERVICAL DISCECTOMY     CESAREAN     COLONOSCOPY     COLONOSCOPY N/A 07/25/2017   Procedure: COLONOSCOPY;  Surgeon: Christena Deem, MD;  Location: [facility] ENDOSCOPY;  Service: Endoscopy;  Laterality: N/A;   esophagitis     KNEE SURGERY Left    LEEP     TUBAL LIGATION      Social History   Socioeconomic History   Marital status: Widowed    Spouse name: Trey Paula   Number of children: 2    Years of education: Not on file   Highest education level: Not on file  Occupational History   Occupation: Radiology Tech    Employer: [employer]  Tobacco Use   Smoking status: Former    Current packs/day: 0.00    Types: Cigarettes    Quit date: 11/15/2000    Years since quitting: 22.6   Smokeless tobacco: Never  Vaping Use   Vaping status: Never Used  Substance and Sexual Activity   Alcohol use: No   Drug use: No   Sexual activity: Yes    Birth control/protection: Surgical  Other Topics Concern   Not on file  Social History Narrative   Not on file   Social Determinants of Health   Financial Resource Strain: Not on file  Food Insecurity: Not on file  Transportation Needs: Not on file  Physical Activity: Inactive (10/04/2018)   Exercise Vital Sign    Days of Exercise per Week: 0 days    Minutes of Exercise per Session: 0 min  Stress: Not on file  Social Connections: Not on file  Intimate Partner Violence: Not At Risk (10/04/2018)   Humiliation, Afraid, Rape, and Kick questionnaire  Fear of Current or Ex-Partner: No    Emotionally Abused: No    Physically Abused: No    Sexually Abused: No    Family History  Problem Relation Age of Onset   Heart disease Father    Breast cancer Maternal Aunt    Breast cancer Daughter    Breast cancer Maternal Aunt    Diabetes Neg Hx    Colon cancer Neg Hx    Ovarian cancer Neg Hx     Allergies  Allergen Reactions   Vancomycin Hives   Sulfa Antibiotics Rash   Sulfasalazine Rash    Review of Systems  Musculoskeletal:  Positive for joint pain.  All other systems reviewed and are negative.      Objective:   BP (!) 130/90   Pulse 78   Ht 5\' 4"  (1.626 m)   Wt 247 lb 3.2 oz (112.1 kg)   LMP 07/26/2011 (Approximate)   SpO2 96%   BMI 42.43 kg/m   Vitals:   07/05/23 1400  BP: (!) 130/90  Pulse: 78  Height: 5\' 4"  (1.626 m)  Weight: 247 lb 3.2 oz (112.1 kg)  SpO2: 96%  BMI (Calculated): 42.41     Physical Exam Vitals and nursing note reviewed.  Constitutional:      Appearance: Normal appearance. She is normal weight.  HENT:     Head: Normocephalic.  Eyes:     Pupils: Pupils are equal, round, and reactive to light.  Cardiovascular:     Rate and Rhythm: Normal rate.  Pulmonary:     Effort: Pulmonary effort is normal.  Musculoskeletal:     Right knee: Crepitus present. Tenderness present.     Left knee: Crepitus present. Tenderness present.  Neurological:     General: No focal deficit present.     Mental Status: She is alert and oriented to person, place, and time. Mental status is at baseline.  Psychiatric:        Behavior: Behavior normal.        Thought Content: Thought content normal.        Judgment: Judgment normal.      Results for orders placed or performed in visit on 07/05/23  POCT CBG (Fasting - Glucose)  Result Value Ref Range   Glucose Fasting, POC 146 (A) 70 - 99 mg/dL    Recent Results (from the past 2160 hour(s))  Hemoglobin A1c     Status: Abnormal   Collection Time: 06/28/23  9:07 AM  Result Value Ref Range   Hgb A1c MFr Bld 9.8 (H) 4.8 - 5.6 %    Comment:          Prediabetes: 5.7 - 6.4          Diabetes: >6.4          Glycemic control for adults with diabetes: <7.0    Est. average glucose Bld gHb Est-mCnc 235 mg/dL  TSH     Status: None   Collection Time: 06/28/23  9:07 AM  Result Value Ref Range   TSH 4.480 0.450 - 4.500 uIU/mL  CMP14+EGFR     Status: Abnormal   Collection Time: 06/28/23  9:07 AM  Result Value Ref Range   Glucose 232 (H) 70 - 99 mg/dL   BUN 11 8 - 27 mg/dL   Creatinine, Ser 3.08 0.57 - 1.00 mg/dL   eGFR 83 >65 HQ/ION/6.29   BUN/Creatinine Ratio 14 12 - 28   Sodium 142 134 - 144 mmol/L   Potassium 4.8 3.5 -  5.2 mmol/L   Chloride 104 96 - 106 mmol/L   CO2 24 20 - 29 mmol/L   Calcium 9.4 8.7 - 10.3 mg/dL   Total Protein 6.9 6.0 - 8.5 g/dL   Albumin 4.1 3.9 - 4.9 g/dL   Globulin, Total 2.8 1.5 - 4.5 g/dL    Bilirubin Total 0.3 0.0 - 1.2 mg/dL   Alkaline Phosphatase 112 44 - 121 IU/L   AST 32 0 - 40 IU/L   ALT 35 (H) 0 - 32 IU/L  Lipid panel     Status: Abnormal   Collection Time: 06/28/23  9:07 AM  Result Value Ref Range   Cholesterol, Total 109 100 - 199 mg/dL   Triglycerides 161 (H) 0 - 149 mg/dL   HDL 29 (L) >09 mg/dL   VLDL Cholesterol Cal 34 5 - 40 mg/dL   LDL Chol Calc (NIH) 46 0 - 99 mg/dL   Chol/HDL Ratio 3.8 0.0 - 4.4 ratio    Comment:                                   T. Chol/HDL Ratio                                             Men  Women                               1/2 Avg.Risk  3.4    3.3                                   Avg.Risk  5.0    4.4                                2X Avg.Risk  9.6    7.1                                3X Avg.Risk 23.4   11.0   CBC with Diff     Status: Abnormal   Collection Time: 06/28/23  9:07 AM  Result Value Ref Range   WBC 6.5 3.4 - 10.8 x10E3/uL   RBC 5.05 3.77 - 5.28 x10E6/uL   Hemoglobin 14.9 11.1 - 15.9 g/dL   Hematocrit 60.4 (H) 54.0 - 46.6 %   MCV 93 79 - 97 fL   MCH 29.5 26.6 - 33.0 pg   MCHC 31.8 31.5 - 35.7 g/dL   RDW 98.1 19.1 - 47.8 %   Platelets 244 150 - 450 x10E3/uL   Neutrophils 53 Not Estab. %   Lymphs 35 Not Estab. %   Monocytes 9 Not Estab. %   Eos 2 Not Estab. %   Basos 1 Not Estab. %   Neutrophils Absolute 3.5 1.4 - 7.0 x10E3/uL   Lymphocytes Absolute 2.3 0.7 - 3.1 x10E3/uL   Monocytes Absolute 0.6 0.1 - 0.9 x10E3/uL   EOS (ABSOLUTE) 0.1 0.0 - 0.4 x10E3/uL   Basophils Absolute 0.0 0.0 - 0.2 x10E3/uL   Immature Granulocytes 0 Not Estab. %   Immature Grans (Abs) 0.0 0.0 -  0.1 x10E3/uL  POCT CBG (Fasting - Glucose)     Status: Abnormal   Collection Time: 07/05/23  2:04 PM  Result Value Ref Range   Glucose Fasting, POC 146 (A) 70 - 99 mg/dL       Assessment & Plan:   Problem List Items Addressed This Visit       Active Problems   Essential hypertension    Blood pressure well controlled with current  medications.  Continue current therapy.  Will reassess at follow up.       Pure hypertriglyceridemia    Continue current therapy for lipid control. Will modify as needed based on labwork results.        Obesity, Class III, BMI 40-49.9 (morbid obesity) (HCC)   Relevant Medications   tirzepatide [facility]) 2.5 MG/0.5ML Pen   tirzepatide Greggory Keen) 5 MG/0.5ML Pen (Start on 08/02/2023)   Type II diabetes mellitus with hyperosmolarity, uncontrolled (HCC) - Primary    Adding Mounjaro to regimen.   Continue current other diabetes meds, as patient has been well controlled on current regimen.  Will adjust meds if needed based on labs.       Relevant Medications   tirzepatide [facility]) 2.5 MG/0.5ML Pen   tirzepatide Greggory Keen) 5 MG/0.5ML Pen (Start on 08/02/2023)   Other Relevant Orders   POCT CBG (Fasting - Glucose) (Completed)   Other Visit Diagnoses     Encounter for initial annual wellness visit (AWV) in Medicare patient           Return in about 3 months (around 10/05/2023) for F/U.   Total time spent: 30 minutes  Miki Kins, FNP  07/05/2023   This document may have been prepared by [place] Voice Recognition software and as such may include unintentional dictation errors.

## 2023-08-02 ENCOUNTER — Other Ambulatory Visit: Payer: Self-pay

## 2023-08-02 MED ORDER — DICLOFENAC SODIUM 50 MG PO TBEC: 50.0000 mg | DELAYED_RELEASE_TABLET | Freq: Two times a day (BID) | ORAL | 3 refills | Status: DC

## 2023-08-02 MED ORDER — VORTIOXETINE HBR 5 MG PO TABS: 5.0000 mg | ORAL_TABLET | Freq: Every day | ORAL | 3 refills | Status: AC

## 2023-08-02 MED ORDER — SEMAGLUTIDE (1 MG/DOSE) 4 MG/3ML ~~LOC~~ SOPN: 1.0000 mg | PEN_INJECTOR | SUBCUTANEOUS | 1 refills | Status: AC

## 2023-08-02 MED ORDER — NEBIVOLOL HCL 5 MG PO TABS: 5.0000 mg | ORAL_TABLET | Freq: Every day | ORAL | 3 refills | Status: DC

## 2023-08-02 MED ORDER — ROSUVASTATIN CALCIUM 10 MG PO TABS: 10.0000 mg | ORAL_TABLET | Freq: Every day | ORAL | 3 refills | Status: DC

## 2023-08-26 ENCOUNTER — Telehealth: Payer: Self-pay

## 2023-08-26 NOTE — Telephone Encounter (Signed)
Patient has [employer] NP come out today and they checked her A1C, they reported it was at 8.6%

## 2023-09-22 ENCOUNTER — Other Ambulatory Visit: Payer: Self-pay | Admitting: Family

## 2023-09-22 MED ORDER — SEMAGLUTIDE(0.25 OR 0.5MG/DOS) 2 MG/3ML ~~LOC~~ SOPN: 0.5000 mg | PEN_INJECTOR | SUBCUTANEOUS | 3 refills | Status: AC

## 2023-10-18 ENCOUNTER — Other Ambulatory Visit: Payer: Self-pay | Admitting: Family

## 2023-10-18 MED ORDER — AMOXICILLIN-POT CLAVULANATE 875-125 MG PO TABS: 1.0000 | ORAL_TABLET | Freq: Two times a day (BID) | ORAL | 0 refills | Status: AC

## 2023-12-06 ENCOUNTER — Encounter: Payer: Self-pay | Admitting: Family

## 2023-12-06 MED ORDER — LOSARTAN POTASSIUM 25 MG PO TABS: 25.0000 mg | ORAL_TABLET | Freq: Every day | ORAL | 3 refills | Status: AC

## 2024-01-09 ENCOUNTER — Other Ambulatory Visit: Payer: Self-pay | Admitting: Family

## 2024-03-13 ENCOUNTER — Other Ambulatory Visit: Payer: Self-pay | Admitting: Family

## 2024-03-13 MED ORDER — GEMTESA 75 MG PO TABS: 1.0000 | ORAL_TABLET | Freq: Every day | ORAL | 1 refills | Status: AC

## 2024-04-03 ENCOUNTER — Other Ambulatory Visit

## 2024-04-03 DIAGNOSIS — D519 Vitamin B12 deficiency anemia, unspecified: Secondary | ICD-10-CM

## 2024-04-03 DIAGNOSIS — I10 Essential (primary) hypertension: Secondary | ICD-10-CM

## 2024-04-03 DIAGNOSIS — E781 Pure hyperglyceridemia: Secondary | ICD-10-CM

## 2024-04-03 DIAGNOSIS — E1100 Type 2 diabetes mellitus with hyperosmolarity without nonketotic hyperglycemic-hyperosmolar coma (NKHHC): Secondary | ICD-10-CM

## 2024-04-03 DIAGNOSIS — E559 Vitamin D deficiency, unspecified: Secondary | ICD-10-CM

## 2024-04-03 DIAGNOSIS — E66813 Obesity, class 3: Secondary | ICD-10-CM

## 2024-04-04 LAB — VITAMIN D 25 HYDROXY (VIT D DEFICIENCY, FRACTURES): Vit D, 25-Hydroxy: 32.7 ng/mL (ref 30.0–100.0)

## 2024-04-04 LAB — CMP14+EGFR
ALT: 34 IU/L — ABNORMAL HIGH (ref 0–32)
AST: 32 IU/L (ref 0–40)
Albumin: 4 g/dL (ref 3.9–4.9)
Alkaline Phosphatase: 119 IU/L (ref 44–121)
BUN/Creatinine Ratio: 22 (ref 12–28)
BUN: 15 mg/dL (ref 8–27)
Bilirubin Total: 0.4 mg/dL (ref 0.0–1.2)
CO2: 24 mmol/L (ref 20–29)
Calcium: 9.4 mg/dL (ref 8.7–10.3)
Chloride: 103 mmol/L (ref 96–106)
Creatinine, Ser: 0.68 mg/dL (ref 0.57–1.00)
Globulin, Total: 2.5 g/dL (ref 1.5–4.5)
Glucose: 223 mg/dL — ABNORMAL HIGH (ref 70–99)
Potassium: 4.6 mmol/L (ref 3.5–5.2)
Sodium: 143 mmol/L (ref 134–144)
Total Protein: 6.5 g/dL (ref 6.0–8.5)
eGFR: 97 mL/min/{1.73_m2} (ref >59)

## 2024-04-04 LAB — LIPID PANEL
Chol/HDL Ratio: 3.7 ratio (ref 0.0–4.4)
Cholesterol, Total: 101 mg/dL (ref 100–199)
HDL: 27 mg/dL — ABNORMAL LOW (ref >39)
LDL Chol Calc (NIH): 49 mg/dL (ref 0–99)
Triglycerides: 144 mg/dL (ref 0–149)
VLDL Cholesterol Cal: 25 mg/dL (ref 5–40)

## 2024-04-04 LAB — VITAMIN B12: Vitamin B-12: 387 pg/mL (ref 232–1245)

## 2024-04-04 LAB — TSH: TSH: 4.47 u[IU]/mL (ref 0.450–4.500)

## 2024-04-04 LAB — HEMOGLOBIN A1C
Est. average glucose Bld gHb Est-mCnc: 255 mg/dL
Hgb A1c MFr Bld: 10.5 % — ABNORMAL HIGH (ref 4.8–5.6)

## 2024-04-05 ENCOUNTER — Ambulatory Visit: Payer: Self-pay

## 2024-04-17 ENCOUNTER — Telehealth: Payer: Self-pay

## 2024-04-17 NOTE — Progress Notes (Signed)
   04/17/2024  Patient ID: Gloria Wilkins, female   DOB: 16-Nov-1955, 68 y.o.   MRN: [id number]  Submitted PAP applications for Ozempic  1mg  ([employer]) and Gemtesa  ([employer]). Pending company response.  Carnell Christian, PharmD Clinical Pharmacist [phone]

## 2024-05-01 ENCOUNTER — Telehealth: Payer: Self-pay

## 2024-05-01 NOTE — Progress Notes (Addendum)
   05/01/2024  Patient ID: Gloria Wilkins, female   DOB: October 20, 1956, 68 y.o.   MRN: [id number]  Contacted [employer] to follow up on status of patient's application for Ozempic  1mg .  Patient has been approved through 11/14/24 to receive the medication. First shipment expected to arrive in 10-14 business days at the office.  Contacted [employer] to follow up on status of patient's application for Trintellix  5mg . Patient has been approved through 11/14/24 and first shipment was delivered on 04/26/24.  Provided patient with contact phone number for Gemtesa  assistance. Patient will have to call to begin that process.  Carnell Christian, PharmD Clinical Pharmacist [phone]

## 2024-06-15 ENCOUNTER — Other Ambulatory Visit: Payer: Self-pay

## 2024-06-15 MED ORDER — DICLOFENAC SODIUM 50 MG PO TBEC: 50.0000 mg | DELAYED_RELEASE_TABLET | Freq: Two times a day (BID) | ORAL | 3 refills | Status: AC

## 2024-06-15 MED ORDER — ROSUVASTATIN CALCIUM 10 MG PO TABS: 10.0000 mg | ORAL_TABLET | Freq: Every day | ORAL | 3 refills | Status: AC

## 2024-06-15 MED ORDER — NEBIVOLOL HCL 5 MG PO TABS: 5.0000 mg | ORAL_TABLET | Freq: Every day | ORAL | 3 refills | Status: AC
